# Patient Record
Sex: Male | Born: 1935 | Race: White | Hispanic: No | Marital: Married | Smoking: Never smoker
Health system: Southern US, Community
[De-identification: ages and names within clinical notes are randomized; demographics above are authoritative.]

---

## 1998-03-07 ENCOUNTER — Ambulatory Visit (HOSPITAL_COMMUNITY): Admission: RE | Admit: 1998-03-07 | Discharge: 1998-03-07 | Payer: Self-pay

## 1998-06-05 ENCOUNTER — Other Ambulatory Visit: Admission: RE | Admit: 1998-06-05 | Discharge: 1998-06-05 | Payer: Self-pay | Admitting: Gastroenterology

## 2000-01-26 ENCOUNTER — Ambulatory Visit (HOSPITAL_COMMUNITY): Admission: RE | Admit: 2000-01-26 | Discharge: 2000-01-26 | Payer: Self-pay | Admitting: Orthopedic Surgery

## 2002-11-09 ENCOUNTER — Encounter: Payer: Self-pay | Admitting: Orthopedic Surgery

## 2002-11-09 ENCOUNTER — Encounter: Admission: RE | Admit: 2002-11-09 | Discharge: 2002-11-09 | Payer: Self-pay | Admitting: Orthopedic Surgery

## 2005-09-15 ENCOUNTER — Ambulatory Visit: Payer: Self-pay | Admitting: Gastroenterology

## 2005-10-08 ENCOUNTER — Ambulatory Visit: Payer: Self-pay | Admitting: Cardiology

## 2005-10-14 ENCOUNTER — Ambulatory Visit: Payer: Self-pay | Admitting: Internal Medicine

## 2005-10-28 ENCOUNTER — Ambulatory Visit: Payer: Self-pay | Admitting: Cardiology

## 2005-10-29 ENCOUNTER — Ambulatory Visit: Payer: Self-pay | Admitting: Cardiology

## 2005-11-03 ENCOUNTER — Ambulatory Visit: Payer: Self-pay | Admitting: Internal Medicine

## 2005-11-05 ENCOUNTER — Ambulatory Visit: Payer: Self-pay | Admitting: Cardiology

## 2005-11-17 ENCOUNTER — Ambulatory Visit: Payer: Self-pay | Admitting: Internal Medicine

## 2005-12-28 ENCOUNTER — Ambulatory Visit: Payer: Self-pay | Admitting: Internal Medicine

## 2006-03-07 ENCOUNTER — Ambulatory Visit: Payer: Self-pay | Admitting: Internal Medicine

## 2006-12-06 ENCOUNTER — Ambulatory Visit: Payer: Self-pay | Admitting: Internal Medicine

## 2006-12-06 LAB — CONVERTED CEMR LAB
ALT: 17 units/L (ref 0–40)
AST: 20 units/L (ref 0–37)
Alkaline Phosphatase: 54 units/L (ref 39–117)
BUN: 31 mg/dL — ABNORMAL HIGH (ref 6–23)
Basophils Relative: 0.2 % (ref 0.0–1.0)
Bilirubin, Direct: 0.2 mg/dL (ref 0.0–0.3)
CO2: 27 meq/L (ref 19–32)
Calcium: 9.1 mg/dL (ref 8.4–10.5)
Chloride: 111 meq/L (ref 96–112)
Creatinine, Ser: 1.1 mg/dL (ref 0.4–1.5)
Eosinophils Relative: 2.2 % (ref 0.0–5.0)
GFR calc Af Amer: 84 mL/min
Glucose, Bld: 105 mg/dL — ABNORMAL HIGH (ref 70–99)
LDL Cholesterol: 100 mg/dL — ABNORMAL HIGH (ref 0–99)
Lymphocytes Relative: 27.3 % (ref 12.0–46.0)
Monocytes Relative: 9.3 % (ref 3.0–11.0)
Neutro Abs: 3 10*3/uL (ref 1.4–7.7)
Platelets: 266 10*3/uL (ref 150–400)
RDW: 12.9 % (ref 11.5–14.6)
Total Bilirubin: 0.9 mg/dL (ref 0.3–1.2)
Total Protein: 6.2 g/dL (ref 6.0–8.3)
Triglycerides: 45 mg/dL (ref 0–149)
VLDL: 9 mg/dL (ref 0–40)
WBC: 5 10*3/uL (ref 4.5–10.5)

## 2007-05-03 ENCOUNTER — Ambulatory Visit: Payer: Self-pay | Admitting: Pulmonary Disease

## 2007-05-03 ENCOUNTER — Ambulatory Visit: Payer: Self-pay | Admitting: Internal Medicine

## 2007-07-25 ENCOUNTER — Telehealth: Payer: Self-pay | Admitting: Internal Medicine

## 2007-08-07 IMAGING — CT CT PARANASAL SINUSES LIMITED
1 of 2 series · 16 of 30 positions shown, 20 images · IV contrast (agent unspecified)
Comparison: None.

CLINICAL DATA: Cough and sinus drainage for 2 months.  No previous sinus surgery.
LIMITED CT OF PARANASAL SINUSES WITHOUT CONTRAST:
TECHNIQUE: Limited coronal CT images were obtained through the paranasal sinuses without intravenous contrast.

[Series 4: ltd sinus 3.0 h30s · axial · 0.29mm/px · z∈[-106,+17]mm · 16 of 30 slices shown, 20 images]
[im 2/30  brain]
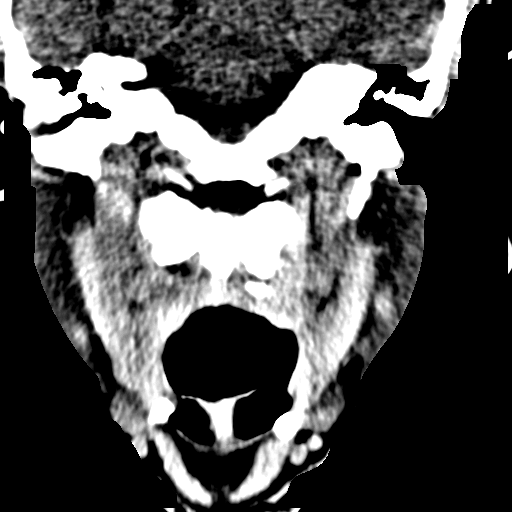
[im 2/30  bone]
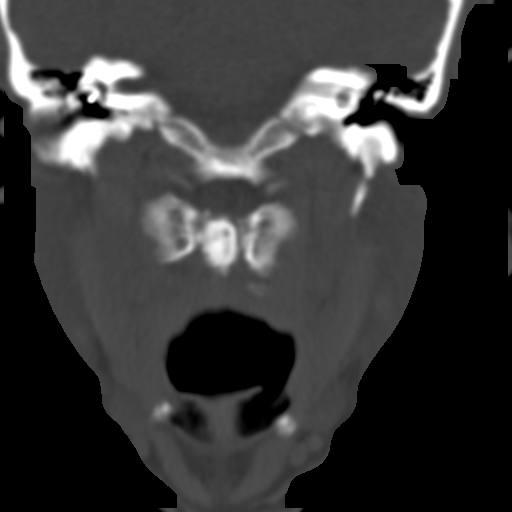
[im 4/30  bone]
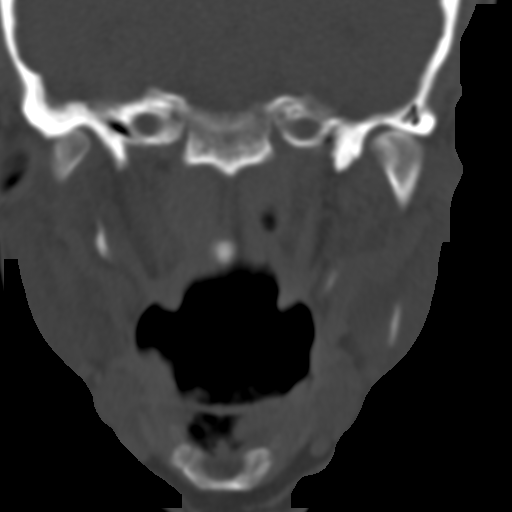
[im 6/30  bone]
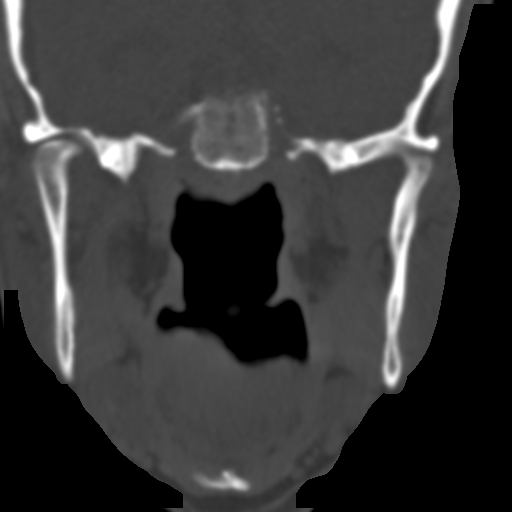
[im 7/30  bone]
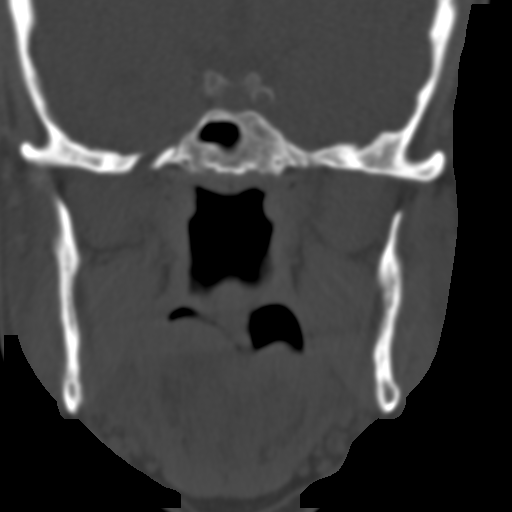
[im 9/30  brain]
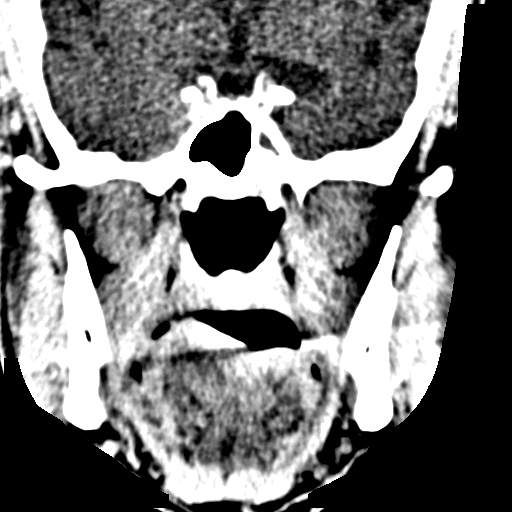
[im 9/30  bone]
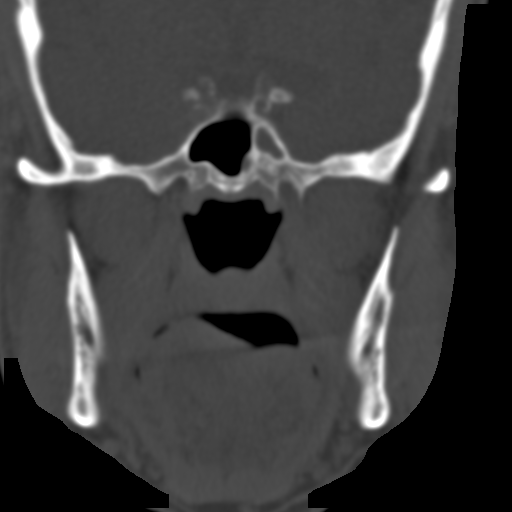
[im 11/30  bone]
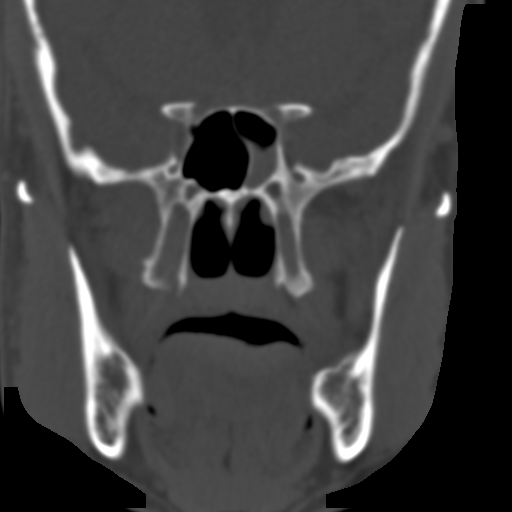
[im 12/30  bone]
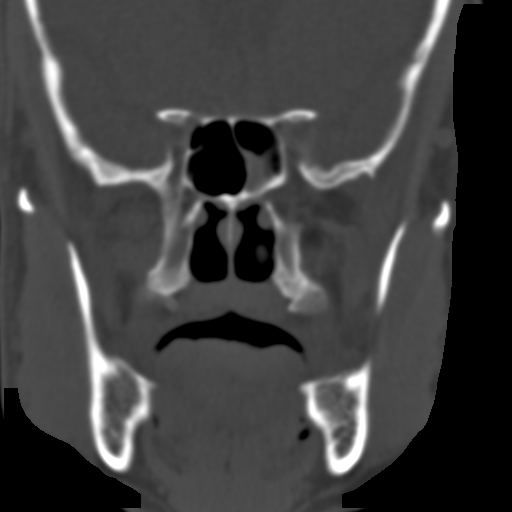
[im 14/30  bone]
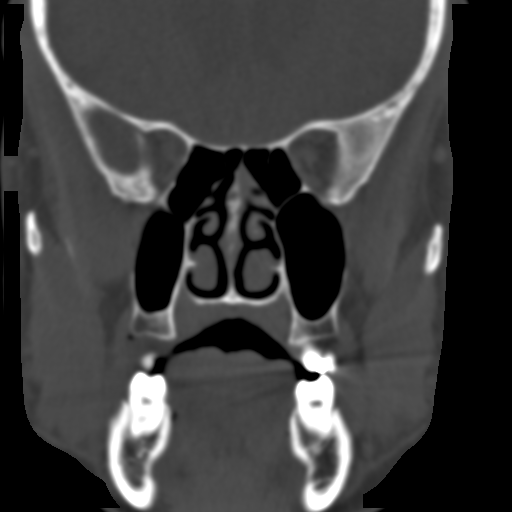
[im 16/30  brain]
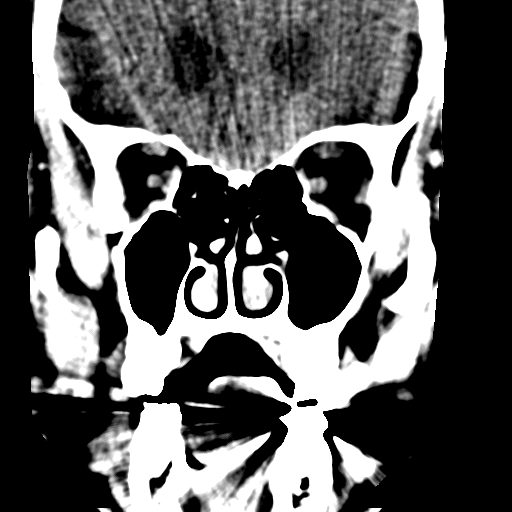
[im 16/30  bone]
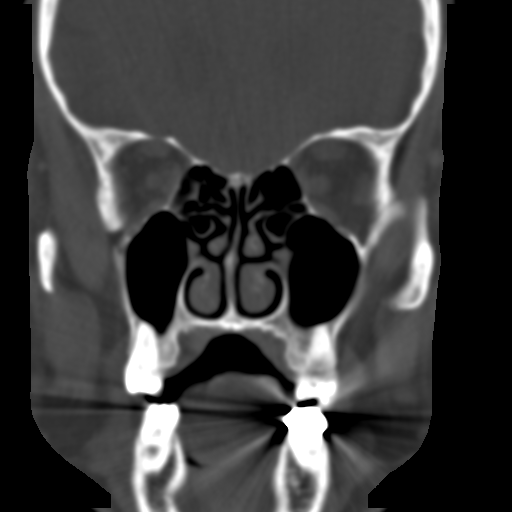
[im 18/30  bone]
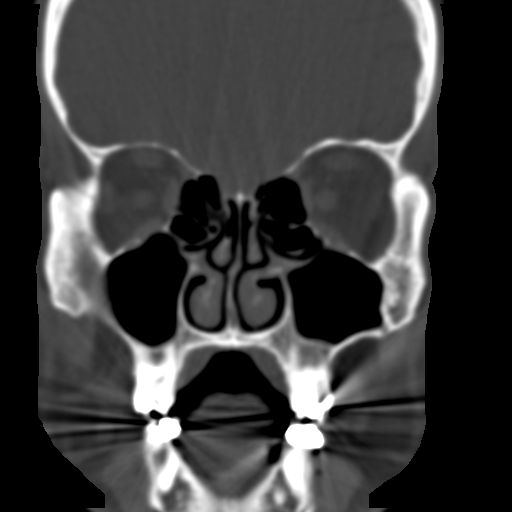
[im 19/30  bone]
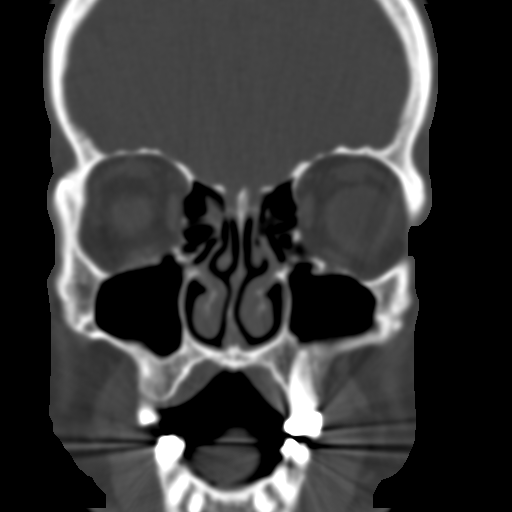
[im 21/30  bone]
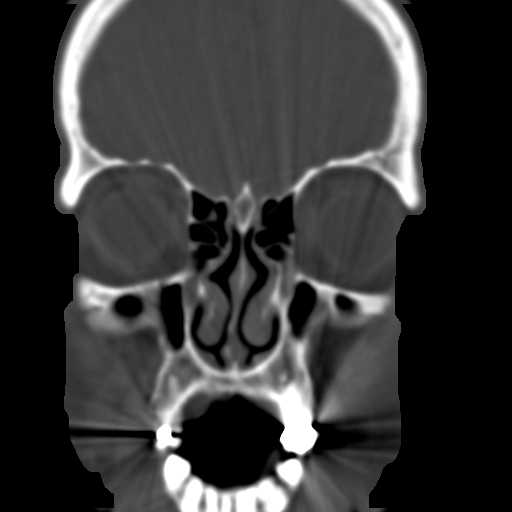
[im 23/30  brain]
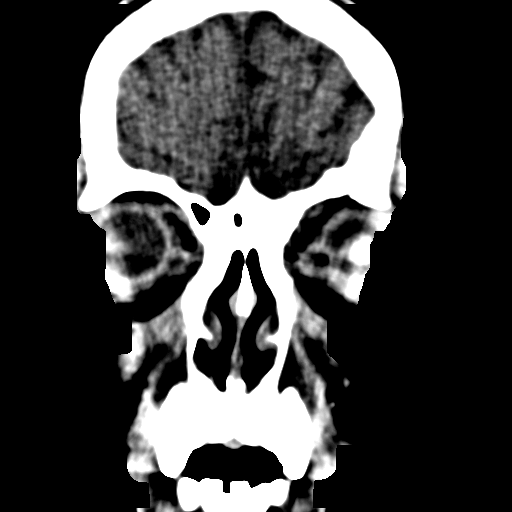
[im 23/30  bone]
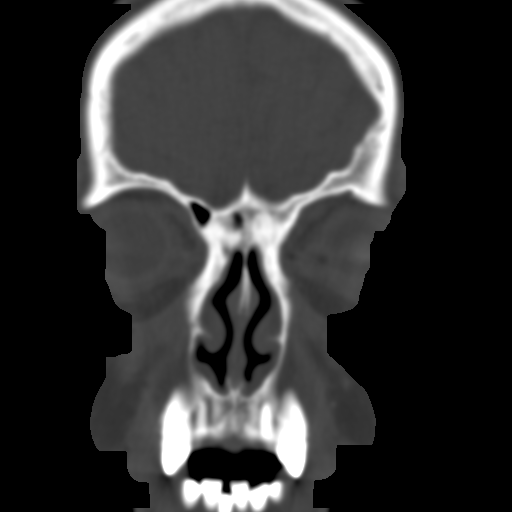
[im 24/30  bone]
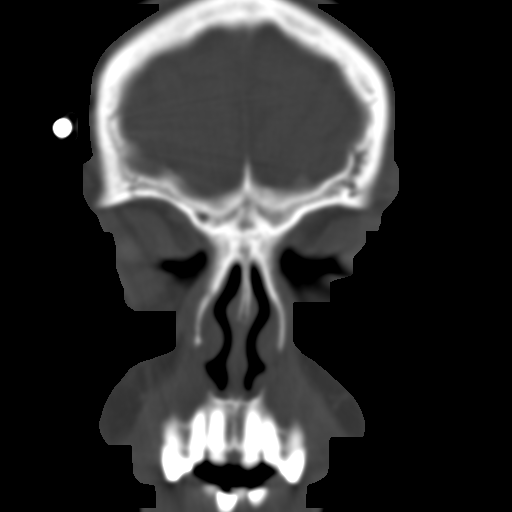
[im 26/30  bone]
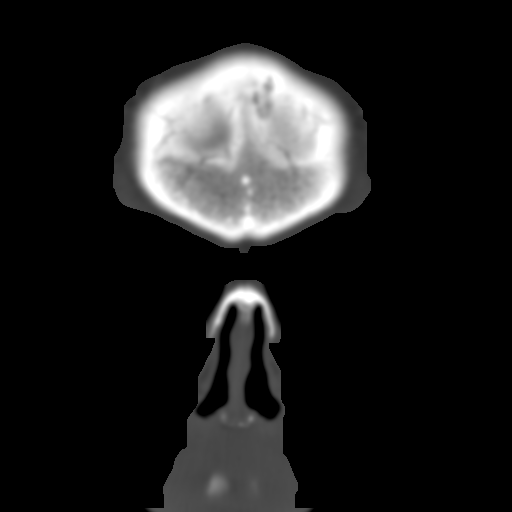
[im 28/30  bone]
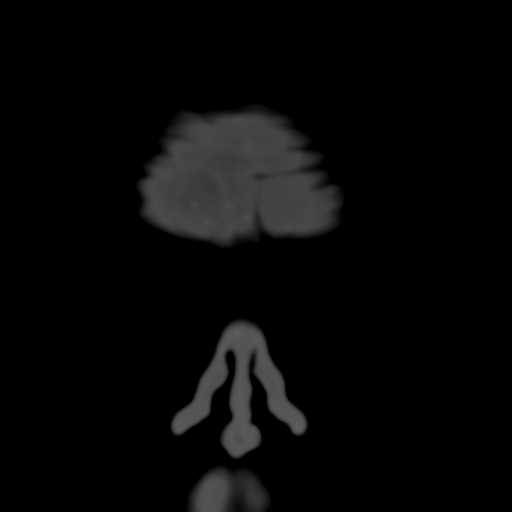

[16 of 30 positions shown; findings below may reference images not displayed]

FINDINGS: There is some mucosal thickening in the left division of the sphenoid sinus.  The paranasal sinuses are otherwise well aerated without mucosal thickening.  The frontal sinuses appear incompletely pneumatized.  No bone destruction or obstructive lesion is demonstrated by this limited screening exam.  The infundibula do appear somewhat narrowed bilaterally.  No orbital abnormalities are seen.  There is an apparent metallic stapes implant on the right.
IMPRESSION: Mild mucosal thickening in the left division of the sphenoid sinus.  No other active sinus disease demonstrated.

## 2007-11-09 ENCOUNTER — Encounter: Payer: Self-pay | Admitting: Internal Medicine

## 2008-05-08 ENCOUNTER — Telehealth (INDEPENDENT_AMBULATORY_CARE_PROVIDER_SITE_OTHER): Payer: Self-pay | Admitting: *Deleted

## 2008-05-08 ENCOUNTER — Ambulatory Visit: Payer: Self-pay | Admitting: Internal Medicine

## 2008-05-08 DIAGNOSIS — R5381 Other malaise: Secondary | ICD-10-CM | POA: Insufficient documentation

## 2008-05-08 DIAGNOSIS — J309 Allergic rhinitis, unspecified: Secondary | ICD-10-CM | POA: Insufficient documentation

## 2008-05-08 DIAGNOSIS — R350 Frequency of micturition: Secondary | ICD-10-CM

## 2008-05-08 DIAGNOSIS — R5383 Other fatigue: Secondary | ICD-10-CM

## 2008-05-08 DIAGNOSIS — E785 Hyperlipidemia, unspecified: Secondary | ICD-10-CM

## 2008-05-08 DIAGNOSIS — N318 Other neuromuscular dysfunction of bladder: Secondary | ICD-10-CM

## 2008-05-08 DIAGNOSIS — N400 Enlarged prostate without lower urinary tract symptoms: Secondary | ICD-10-CM

## 2008-05-08 DIAGNOSIS — Z860100 Personal history of colon polyps, unspecified: Secondary | ICD-10-CM | POA: Insufficient documentation

## 2008-05-08 DIAGNOSIS — Z8719 Personal history of other diseases of the digestive system: Secondary | ICD-10-CM

## 2008-05-08 DIAGNOSIS — I10 Essential (primary) hypertension: Secondary | ICD-10-CM | POA: Insufficient documentation

## 2008-05-08 DIAGNOSIS — K219 Gastro-esophageal reflux disease without esophagitis: Secondary | ICD-10-CM | POA: Insufficient documentation

## 2008-05-08 DIAGNOSIS — N453 Epididymo-orchitis: Secondary | ICD-10-CM | POA: Insufficient documentation

## 2008-05-08 LAB — CONVERTED CEMR LAB
ALT: 16 units/L (ref 0–53)
AST: 18 units/L (ref 0–37)
Albumin: 3.8 g/dL (ref 3.5–5.2)
BUN: 37 mg/dL — ABNORMAL HIGH (ref 6–23)
Basophils Absolute: 0 10*3/uL (ref 0.0–0.1)
Basophils Relative: 0.5 % (ref 0.0–3.0)
CO2: 28 meq/L (ref 19–32)
Chloride: 107 meq/L (ref 96–112)
Cholesterol: 177 mg/dL (ref 0–200)
Creatinine, Ser: 1.5 mg/dL (ref 0.4–1.5)
Crystals: NEGATIVE
Glucose, Bld: 93 mg/dL (ref 70–99)
Hemoglobin: 12.2 g/dL — ABNORMAL LOW (ref 13.0–17.0)
Ketones, ur: NEGATIVE mg/dL
LDL Cholesterol: 107 mg/dL — ABNORMAL HIGH (ref 0–99)
Leukocytes, UA: NEGATIVE
Lymphocytes Relative: 25.8 % (ref 12.0–46.0)
MCHC: 35.4 g/dL (ref 30.0–36.0)
Monocytes Relative: 10.5 % (ref 3.0–12.0)
Neutro Abs: 3.5 10*3/uL (ref 1.4–7.7)
Neutrophils Relative %: 61.5 % (ref 43.0–77.0)
Nitrite: NEGATIVE
PSA: 0.4 ng/mL (ref 0.10–4.00)
RBC / HPF: NONE SEEN
RBC: 3.58 M/uL — ABNORMAL LOW (ref 4.22–5.81)
Specific Gravity, Urine: 1.015 (ref 1.000–1.03)
Total CHOL/HDL Ratio: 3.5
Total Protein, Urine: NEGATIVE mg/dL
Total Protein: 6.9 g/dL (ref 6.0–8.3)
VLDL: 19 mg/dL (ref 0–40)
Vitamin B-12: 733 pg/mL (ref 211–911)
WBC: 5.7 10*3/uL (ref 4.5–10.5)
pH: 5 (ref 5.0–8.0)

## 2008-05-09 ENCOUNTER — Telehealth: Payer: Self-pay | Admitting: Internal Medicine

## 2008-05-16 ENCOUNTER — Encounter: Admission: RE | Admit: 2008-05-16 | Discharge: 2008-05-16 | Payer: Self-pay | Admitting: Internal Medicine

## 2008-05-17 ENCOUNTER — Encounter: Payer: Self-pay | Admitting: Internal Medicine

## 2008-12-13 ENCOUNTER — Encounter (INDEPENDENT_AMBULATORY_CARE_PROVIDER_SITE_OTHER): Payer: Self-pay | Admitting: *Deleted

## 2009-09-05 ENCOUNTER — Telehealth: Payer: Self-pay | Admitting: Internal Medicine

## 2010-08-18 NOTE — Progress Notes (Signed)
Summary: Changed Practices  Phone Note Outgoing Call Call back at Home Phone [phone]   Call placed by: Harlow Mares CMA Duncan Dull),  September 05, 2009 11:45 AM Call placed to: Patient Summary of Call: patient states that he had a colonoscopy 3 weeks ago closer to home, I will have Lady Gary put a note in the system that the patient has changed practices.  Initial call taken by: Harlow Mares CMA (AAMA),  September 05, 2009 11:46 AM

## 2010-12-04 NOTE — Assessment & Plan Note (Signed)
[name]                               PULMONARY OFFICE NOTE   NAME:POEAdison, Ian                        MRN:          [id number]  DATE:03/07/2006                            DOB:          Nov 01, 1929    HISTORY:  A 77 year old white male with a cough dating back to February 2007  with marked improvement on PPI plus Reglan 10 mg one-half q.i.d.  It is not  to me that he is actually still taking the medicine, but overall feels much  better.  He has occasional cough in the morning but minimal mucous  production with no chest pain, fevers, chills, sweats or dyspnea.   Again, he does not know what medicines he is taking.   PHYSICAL EXAMINATION:  He is a pleasant, ambulatory white man in no acute  distress.  Stable vital signs.  HEENT:  Unremarkable.  Pharynx clear.  LUNGS:  Fields are completely clear bilaterally to auscultation and  percussion.  HEART:  Regular rate and rhythm without murmur, gallop or rub.  ABDOMEN:  Soft, benign.  EXTREMITIES:  Warm without calf tenderness, cyanosis, clubbing or edema.   IMPRESSION:  This patient appears to have clear-cut evidence of  gastroesophageal reflux disease with previous response to proton pump  inhibitor and then further improvement on promotility agents.  The issues is  whether or not he needs to continue aggressive treatment directed at  gastroesophageal reflux disease long-term.  Since he did not have the  chronic cough previously, I am going to ask him to just completely stop all  the medicines, the reverse of a therapeutic trial.  If the cough flares, I  am going to ask him to restart all the medicines that previously controlled  the cough but then present here with all of his medicines in hand so that we  can actually do full medication reconciliation and consider whether or not  gastrointestinal evaluation is necessary (if he requires aggressive  treatment directed at gastroesophageal reflux  disease chronically, I would  strongly recommend at least one endoscopy to rule out other complications of  chronic gastroesophageal reflux disease such as stricture or Barrett's,  noting that Dr. Victorino Dike is his gastrointestinal physician of record.)  Pulmonary followup, however, can be p.r.n.                                   [name]. [name], MD, [facility]   MBW/MedQ  DD:  03/07/2006  DT:  03/07/2006  Job #:  [id number]

## 2010-12-04 NOTE — Op Note (Signed)
[place]  Patient:    MACIO, KISSOON                        MRN: [id number] Proc. Date: 01/26/00 Adm. Date:  [id number] Attending:  Marlowe Kays Page                           Operative Report  PREOPERATIVE DIAGNOSIS:  Left carpal tunnel syndrome, status post right carpal tunnel release.  POSTOPERATIVE DIAGNOSIS:  Left carpal tunnel syndrome, status post right carpal tunnel release.  OPERATION:  Decompression of median nerve, left wrist and hand.  SURGEON:  Illene Labrador. [place], M.D.  ASSISTANT:  Nurse.  ANESTHESIA:  IV regional.  PATHOLOGY AND JUSTIFICATION FOR PROCEDURE:  I performed carpal tunnel surgery on his right hand in 1997.  He has had a nice result from it.  At that time, conduction studies demonstrated delay in the left hand, but until recently, he has not had nothing in the way of symptoms to warrant treatment ______ of the hand.  He has significant numbness in the fingers.  At surgery, he had significant compression of the median nerve in the carpal canal, and much of this compression was due to thenar muscles lying on top of the nerve and which gave better explanation for the type of symptoms he was having.  When I ______, he also had a splinter remnant in the tip of his index finger, but he had taken care of that prior to surgery on his own at home, so that no surgery here was indicated.  DESCRIPTION OF PROCEDURE:  Satisfactory IV regional anesthesia.  DuraPrep from the forearm to fingertips was draped in the sterile field.  I marked out a curved incision along the base of the thenar eminence, crossing obliquely at the flexor crease, the wrist, and the distal forearm.  The palmaris longus tendon beneath the median nerve were identified at the wrist.  Using bipolar cautery, potential bleeders were coagulated.  Decompression of the nerve was made into the distal palm, releasing skin, subcutaneous tissue, thick fascia, and  actually the tendon of it in the carpal canal.  When the decompression had been completed.  The neurovascular bundle dissected out with a small hemostat and the wound was irrigated with sterile saline, and the wound closed.  The skin and subcutaneous tissue only with interrupted 4-0 nylon mattress sutures.  The wound edges were infiltrated with 0.5% plain Marcaine.  Betadine Adaptic dry sterile dressing and a volar plaster splint were applied.  The tourniquet was released and he tolerated the procedure well and was taken to the recovery room in satisfactory condition with no known complications. DD:  01/26/00 TD:  01/26/00 Job: [id number]

## 2014-05-25 ENCOUNTER — Encounter: Payer: Self-pay | Admitting: Internal Medicine

## 2014-05-25 ENCOUNTER — Inpatient Hospital Stay (HOSPITAL_COMMUNITY)
Admission: AD | Admit: 2014-05-25 | Discharge: 2014-05-30 | DRG: 193 | Disposition: A | Discharge status: Skilled Nursing Facility | Payer: Medicare Other | Source: Other Acute Inpatient Hospital | Attending: Family Medicine | Admitting: Family Medicine

## 2014-05-25 ENCOUNTER — Inpatient Hospital Stay (HOSPITAL_COMMUNITY): Payer: Medicare Other

## 2014-05-25 DIAGNOSIS — I701 Atherosclerosis of renal artery: Secondary | ICD-10-CM | POA: Diagnosis present

## 2014-05-25 DIAGNOSIS — Z95828 Presence of other vascular implants and grafts: Secondary | ICD-10-CM | POA: Diagnosis not present

## 2014-05-25 DIAGNOSIS — J189 Pneumonia, unspecified organism: Secondary | ICD-10-CM | POA: Diagnosis present

## 2014-05-25 DIAGNOSIS — I10 Essential (primary) hypertension: Secondary | ICD-10-CM

## 2014-05-25 DIAGNOSIS — N179 Acute kidney failure, unspecified: Secondary | ICD-10-CM | POA: Diagnosis not present

## 2014-05-25 DIAGNOSIS — Z66 Do not resuscitate: Secondary | ICD-10-CM | POA: Diagnosis present

## 2014-05-25 DIAGNOSIS — R739 Hyperglycemia, unspecified: Secondary | ICD-10-CM | POA: Diagnosis present

## 2014-05-25 DIAGNOSIS — N400 Enlarged prostate without lower urinary tract symptoms: Secondary | ICD-10-CM | POA: Diagnosis present

## 2014-05-25 DIAGNOSIS — Y95 Nosocomial condition: Secondary | ICD-10-CM | POA: Diagnosis present

## 2014-05-25 DIAGNOSIS — R0602 Shortness of breath: Secondary | ICD-10-CM | POA: Insufficient documentation

## 2014-05-25 DIAGNOSIS — Z79899 Other long term (current) drug therapy: Secondary | ICD-10-CM | POA: Diagnosis not present

## 2014-05-25 DIAGNOSIS — D649 Anemia, unspecified: Secondary | ICD-10-CM | POA: Diagnosis present

## 2014-05-25 DIAGNOSIS — I482 Chronic atrial fibrillation: Secondary | ICD-10-CM

## 2014-05-25 DIAGNOSIS — J9601 Acute respiratory failure with hypoxia: Secondary | ICD-10-CM | POA: Diagnosis present

## 2014-05-25 DIAGNOSIS — E876 Hypokalemia: Secondary | ICD-10-CM | POA: Diagnosis not present

## 2014-05-25 DIAGNOSIS — I129 Hypertensive chronic kidney disease with stage 1 through stage 4 chronic kidney disease, or unspecified chronic kidney disease: Secondary | ICD-10-CM | POA: Diagnosis present

## 2014-05-25 DIAGNOSIS — N183 Chronic kidney disease, stage 3 unspecified: Secondary | ICD-10-CM | POA: Diagnosis present

## 2014-05-25 DIAGNOSIS — R7989 Other specified abnormal findings of blood chemistry: Secondary | ICD-10-CM | POA: Diagnosis present

## 2014-05-25 DIAGNOSIS — T501X5A Adverse effect of loop [high-ceiling] diuretics, initial encounter: Secondary | ICD-10-CM | POA: Diagnosis present

## 2014-05-25 DIAGNOSIS — I4891 Unspecified atrial fibrillation: Secondary | ICD-10-CM | POA: Diagnosis present

## 2014-05-25 DIAGNOSIS — F0390 Unspecified dementia without behavioral disturbance: Secondary | ICD-10-CM

## 2014-05-25 DIAGNOSIS — Z885 Allergy status to narcotic agent status: Secondary | ICD-10-CM

## 2014-05-25 DIAGNOSIS — E785 Hyperlipidemia, unspecified: Secondary | ICD-10-CM | POA: Diagnosis present

## 2014-05-25 DIAGNOSIS — J9600 Acute respiratory failure, unspecified whether with hypoxia or hypercapnia: Secondary | ICD-10-CM

## 2014-05-25 LAB — BLOOD GAS, ARTERIAL
Acid-base deficit: 3.3 mmol/L — ABNORMAL HIGH (ref 0.0–2.0)
Bicarbonate: 19.7 mEq/L — ABNORMAL LOW (ref 20.0–24.0)
Drawn by: 10552
O2 Content: 4 L/min
O2 Saturation: 94.9 %
PCO2 ART: 26.4 mmHg — AB (ref 35.0–45.0)
PO2 ART: 72.4 mmHg — AB (ref 80.0–100.0)
Patient temperature: 98.6
TCO2: 20.5 mmol/L (ref 0–100)
pH, Arterial: 7.484 — ABNORMAL HIGH (ref 7.350–7.450)

## 2014-05-25 LAB — LIPID PANEL
CHOL/HDL RATIO: 3.7 ratio
Cholesterol: 147 mg/dL (ref 0–200)
HDL: 40 mg/dL (ref >39)
LDL CALC: 92 mg/dL (ref 0–99)
Triglycerides: 74 mg/dL (ref <150)
VLDL: 15 mg/dL (ref 0–40)

## 2014-05-25 LAB — CBC WITH DIFFERENTIAL/PLATELET
Basophils Absolute: 0 10*3/uL (ref 0.0–0.1)
Basophils Relative: 0 % (ref 0–1)
EOS ABS: 0 10*3/uL (ref 0.0–0.7)
EOS PCT: 0 % (ref 0–5)
HEMATOCRIT: 32.2 % — AB (ref 39.0–52.0)
Hemoglobin: 10.8 g/dL — ABNORMAL LOW (ref 13.0–17.0)
LYMPHS ABS: 0.9 10*3/uL (ref 0.7–4.0)
LYMPHS PCT: 6 % — AB (ref 12–46)
MCH: 30.1 pg (ref 26.0–34.0)
MCHC: 33.5 g/dL (ref 30.0–36.0)
MCV: 89.7 fL (ref 78.0–100.0)
MONO ABS: 0.9 10*3/uL (ref 0.1–1.0)
Monocytes Relative: 6 % (ref 3–12)
Neutro Abs: 12.3 10*3/uL — ABNORMAL HIGH (ref 1.7–7.7)
Neutrophils Relative %: 87 % — ABNORMAL HIGH (ref 43–77)
Platelets: 534 10*3/uL — ABNORMAL HIGH (ref 150–400)
RBC: 3.59 MIL/uL — AB (ref 4.22–5.81)
RDW: 13.3 % (ref 11.5–15.5)
WBC: 14.1 10*3/uL — ABNORMAL HIGH (ref 4.0–10.5)

## 2014-05-25 LAB — COMPREHENSIVE METABOLIC PANEL
ALBUMIN: 2.6 g/dL — AB (ref 3.5–5.2)
ALK PHOS: 58 U/L (ref 39–117)
ALT: 73 U/L — AB (ref 0–53)
AST: 66 U/L — ABNORMAL HIGH (ref 0–37)
Anion gap: 18 — ABNORMAL HIGH (ref 5–15)
BUN: 23 mg/dL (ref 6–23)
CHLORIDE: 97 meq/L (ref 96–112)
CO2: 20 mEq/L (ref 19–32)
Calcium: 9.3 mg/dL (ref 8.4–10.5)
Creatinine, Ser: 1.7 mg/dL — ABNORMAL HIGH (ref 0.50–1.35)
GFR calc Af Amer: 41 mL/min — ABNORMAL LOW (ref >90)
GFR calc non Af Amer: 36 mL/min — ABNORMAL LOW (ref >90)
Glucose, Bld: 158 mg/dL — ABNORMAL HIGH (ref 70–99)
POTASSIUM: 4.5 meq/L (ref 3.7–5.3)
Sodium: 135 mEq/L — ABNORMAL LOW (ref 137–147)
Total Bilirubin: 0.5 mg/dL (ref 0.3–1.2)
Total Protein: 6.8 g/dL (ref 6.0–8.3)

## 2014-05-25 LAB — MRSA PCR SCREENING: MRSA by PCR: NEGATIVE

## 2014-05-25 LAB — TSH: TSH: 0.81 u[IU]/mL (ref 0.350–4.500)

## 2014-05-25 LAB — MAGNESIUM: Magnesium: 1.9 mg/dL (ref 1.5–2.5)

## 2014-05-25 LAB — PHOSPHORUS: Phosphorus: 4.9 mg/dL — ABNORMAL HIGH (ref 2.3–4.6)

## 2014-05-25 MED ORDER — ACETAMINOPHEN 650 MG RE SUPP: 650.0000 mg | Freq: Four times a day (QID) | RECTAL | Status: DC | PRN

## 2014-05-25 MED ORDER — AMLODIPINE BESYLATE 10 MG PO TABS
10.0000 mg | ORAL_TABLET | Freq: Every day | ORAL | Status: DC
  Administered 2014-05-25 – 2014-05-30 (×6): 10 mg via ORAL
  Filled 2014-05-25 (×6): qty 1

## 2014-05-25 MED ORDER — VANCOMYCIN HCL IN DEXTROSE 1-5 GM/200ML-% IV SOLN
1000.0000 mg | INTRAVENOUS | Status: DC
  Administered 2014-05-25 – 2014-05-28 (×4): 1000 mg via INTRAVENOUS
  Filled 2014-05-25 (×6): qty 200

## 2014-05-25 MED ORDER — TAMSULOSIN HCL 0.4 MG PO CAPS
0.4000 mg | ORAL_CAPSULE | Freq: Every day | ORAL | Status: DC
  Administered 2014-05-25 – 2014-05-29 (×5): 0.4 mg via ORAL
  Filled 2014-05-25 (×6): qty 1

## 2014-05-25 MED ORDER — ENOXAPARIN SODIUM 40 MG/0.4ML ~~LOC~~ SOLN
40.0000 mg | SUBCUTANEOUS | Status: DC
  Administered 2014-05-25 – 2014-05-26 (×2): 40 mg via SUBCUTANEOUS
  Filled 2014-05-25 (×3): qty 0.4

## 2014-05-25 MED ORDER — SODIUM CHLORIDE 0.9 % IJ SOLN
3.0000 mL | Freq: Two times a day (BID) | INTRAMUSCULAR | Status: DC
  Administered 2014-05-25 – 2014-05-28 (×5): 3 mL via INTRAVENOUS

## 2014-05-25 MED ORDER — PIPERACILLIN-TAZOBACTAM 3.375 G IVPB
3.3750 g | Freq: Three times a day (TID) | INTRAVENOUS | Status: DC
  Administered 2014-05-25 – 2014-05-29 (×12): 3.375 g via INTRAVENOUS
  Filled 2014-05-25 (×15): qty 50

## 2014-05-25 MED ORDER — MEMANTINE HCL 10 MG PO TABS
10.0000 mg | ORAL_TABLET | Freq: Two times a day (BID) | ORAL | Status: DC
  Administered 2014-05-25 – 2014-05-30 (×10): 10 mg via ORAL
  Filled 2014-05-25 (×11): qty 1

## 2014-05-25 MED ORDER — SODIUM CHLORIDE 0.9 % IV SOLN: 250.0000 mL | INTRAVENOUS | Status: DC | PRN

## 2014-05-25 MED ORDER — FUROSEMIDE 10 MG/ML IJ SOLN
60.0000 mg | Freq: Two times a day (BID) | INTRAMUSCULAR | Status: DC
  Administered 2014-05-25 – 2014-05-30 (×9): 60 mg via INTRAVENOUS
  Filled 2014-05-25 (×12): qty 6

## 2014-05-25 MED ORDER — ACETAMINOPHEN 325 MG PO TABS: 650.0000 mg | ORAL_TABLET | Freq: Four times a day (QID) | ORAL | Status: DC | PRN

## 2014-05-25 MED ORDER — IPRATROPIUM-ALBUTEROL 0.5-2.5 (3) MG/3ML IN SOLN
3.0000 mL | RESPIRATORY_TRACT | Status: DC
  Administered 2014-05-25 – 2014-05-27 (×10): 3 mL via RESPIRATORY_TRACT
  Filled 2014-05-25 (×10): qty 3

## 2014-05-25 MED ORDER — DM-GUAIFENESIN ER 30-600 MG PO TB12
1.0000 | ORAL_TABLET | Freq: Two times a day (BID) | ORAL | Status: DC
  Administered 2014-05-25 – 2014-05-30 (×10): 1 via ORAL
  Filled 2014-05-25 (×11): qty 1

## 2014-05-25 MED ORDER — PANTOPRAZOLE SODIUM 40 MG PO TBEC
40.0000 mg | DELAYED_RELEASE_TABLET | Freq: Every day | ORAL | Status: DC
Start: 2014-05-25 — End: 2014-05-30
  Administered 2014-05-26 – 2014-05-30 (×5): 40 mg via ORAL
  Filled 2014-05-25 (×5): qty 1

## 2014-05-25 MED ORDER — SODIUM CHLORIDE 0.9 % IJ SOLN: 3.0000 mL | INTRAMUSCULAR | Status: DC | PRN

## 2014-05-25 NOTE — H&P (Signed)
[facility] History and Physical  Ian LameClifton F Dewoody ZOX:[id number] DOB: 09-Jul-1930 DOA: 05/25/2014  Referring physician:  PCP: [name], MD  Specialists:   Chief Complaint: Shortness of breath  HPI: [name] is a 80 y.o. male  With a history of atrial fibrillation, hypertension, dementia on no anticoagulation, recent hospitalization and transfer [facility] to [facility] for failure to improve. Patient presented to [facility] for suspected urinary tract infection. Patient was placed on Levaquin and ceftriaxone. He then proceeded to develop pneumonia and was placed on Levaquin and Zosyn. Patient required BiPAP as well as 4 L of nasal cannula. Patient does not wear or use oxygen at home. Per family request, patient was transferred to [facility]. It appears that patient had been given plenty of fluids during his hospitalization for sepsis at [facility]. Echocardiogram was not obtained however patient did have elevated BNP. At the present admission, patient has no complaints. Per family patient has had shortness of breath, cough, some abdominal distention and pain. He did have a bowel movement earlier this morning.  Review of Systems:  Unobtainable from patient due to history of dementia.   Per family: patient complained of SOB, abdominal pain.   No past medical history on file. No past surgical history on file. Social History:  has no tobacco, alcohol, and drug history on file.   Allergies  Allergen Reactions  . Codeine     Reviewed with family.   Mother- stroke  Prior to Admission medications   Not on File   Physical Exam: Filed Vitals:   05/25/14 1620  BP: 149/72  Pulse: 105  Temp: 98.1 F (36.7 C)  Resp: 30     General: Well developed, well nourished, NAD, appears stated age  HEENT: NCAT, PERRLA, EOMI, Anicteic Sclera, mucous membranes moist.   Neck: Supple, +JVD, no masses  Cardiovascular: S1 S2 auscultated, 2/6 SEM,  irregular  Respiratory: diminished breath sounds however are essentially clear, no wheezes or rhonchi is noted  Abdomen: Soft, nontender, mildly distended, + bowel sounds  Extremities: warm dry without cyanosis clubbing or edema in LE.  +edema in LUE  Neuro: AAOx2, cranial nerves grossly intact. Strength equal and bilateral in upper/lower ext  Skin: Without rashes exudates or nodules  Psych: Appropriate mood and affect  Labs on Admission:  Basic Metabolic Panel: No results for input(s): NA, K, CL, CO2, GLUCOSE, BUN, CREATININE, CALCIUM, MG, PHOS in the last 168 hours. Liver Function Tests: No results for input(s): AST, ALT, ALKPHOS, BILITOT, PROT, ALBUMIN in the last 168 hours. No results for input(s): LIPASE, AMYLASE in the last 168 hours. No results for input(s): AMMONIA in the last 168 hours. CBC: No results for input(s): WBC, NEUTROABS, HGB, HCT, MCV, PLT in the last 168 hours. Cardiac Enzymes: No results for input(s): CKTOTAL, CKMB, CKMBINDEX, TROPONINI in the last 168 hours.  BNP (last 3 results) No results for input(s): PROBNP in the last 8760 hours. CBG: No results for input(s): GLUCAP in the last 168 hours.  Radiological Exams on Admission: No results found.  EKG: none Cardiac monitor shows atrial fibrillation, rate 96  Assessment/Plan Acute respiratory failure -Patient was transferred from [facility] to [facility] -Patient was initially admitted to [facility] with pneumonia however failed to improve -possibly complicated by non-resolving pneumonia versus new diagnosis of CHF and atrial fibrillation -Currently requiring 4 L oxygen via nasal cannula as well as BiPAP at night -Patient's family or questions pulmonology consult -Will obtain chest x-ray, CBC  with differential, CMP, echocardiogram -will place patient on empiric antibiotics with vancomycin and Zosyn, DuoNeb treatments, guaifenesin  Elevated BNP -Patient does not have a formal diagnosis of heart  failure however BNP noted I [facility] was over 6000 -Patient was given several liters of fluid during his hospital course at [facility], does appear to be volume overloaded at this time. -He was started on Lasix -Will obtain echocardiogram, continue Lasix IV, monitor his input as well as output and daily weights -Pending results of patient's echocardiogram, may consult cardiology  Sepsis secondary to pneumonia versus urinary tract infection -Patient initially presented with leukocytosis and was febrile to [facility] -Will continue treatment plan as above -will obtain UA and culture as well as chest x-ray  Atrial fibrillation -Patient is not anticoagulation -Patient does have pacemaker  Dementia -Will continue patient on Namenda -hold Exelon patch -Appears to be at baseline per family  Chronic kidney disease, Stage III -patient's creatinine was 1.7 at [facility] -will obtain a CMP and monitor carefully as patient will be on diuretics -Last creatinine here at [facility] was noted in 2009 to be 1.5  History renal artery stenosis -Stable, status post stenting  History of BPH -continue Flomax  Normocytic anemia -last hemoglobin noted at [facility] was 10.5, last hemoglobin in our system was 12.2 in 2009 -Possibly dilutional component -Will continue to monitor CBC  Hyperglycemia -patient does not have a history of diabetes mellitus -Will obtain hemoglobin A1c -will place him on incentive insulin sliding scale CBG monitoring  Hypertension -Continue amlodipine  DVT prophylaxis: Lovenox  Code Status: Full  Condition: guarded  Family Communication: family at bedside. Admission, patients condition and plan of care including tests being ordered have been discussed with the patient and family who indicate understanding and agree with the plan and Code Status.  Disposition Plan: admitted   Time spent: 60 minutes  Ejay Lashley D.O. [facility] Pager  [phone]  If 7PM-7AM, please contact night-coverage [email or web address] Password [email or web address] 05/25/2014, 5:17 PM

## 2014-05-25 NOTE — Progress Notes (Signed)
ANTIBIOTIC CONSULT NOTE - INITIAL  Pharmacy Consult for vancomycin and zosyn Indication: pneumonia  Allergies  Allergen Reactions  . Aricept [Donepezil Hcl] Other (See Comments)    Lost weight, no appetite.  . Codeine Other (See Comments)    Unknown reaction    Patient Measurements: Height: 5\' 9"  (175.3 cm) Weight: 164 lb 14.5 oz (74.8 kg) IBW/kg (Calculated) : 70.7 Adjusted Body Weight:   Vital Signs: Temp: 98.1 F (36.7 C) (11/07 1620) Temp Source: Oral (11/07 1620) BP: 149/72 mmHg (11/07 1620) Pulse Rate: 105 (11/07 1620) Intake/Output from previous day:   Intake/Output from this shift:    Labs: No results for input(s): WBC, HGB, PLT, LABCREA, CREATININE in the last 72 hours. Estimated Creatinine Clearance: 38 mL/min (by C-G formula based on Cr of 1.5). No results for input(s): VANCOTROUGH, VANCOPEAK, VANCORANDOM, GENTTROUGH, GENTPEAK, GENTRANDOM, TOBRATROUGH, TOBRAPEAK, TOBRARND, AMIKACINPEAK, AMIKACINTROU, AMIKACIN in the last 72 hours.   Microbiology: No results found for this or any previous visit (from the past 720 hour(s)).  Medical History: No past medical history on file.  Medications:  Scheduled:  . amLODipine  10 mg Oral Daily  . dextromethorphan-guaiFENesin  1 tablet Oral BID  . enoxaparin (LOVENOX) injection  40 mg Subcutaneous Q24H  . furosemide  60 mg Intravenous Q12H  . ipratropium-albuterol  3 mL Nebulization Q4H  . memantine  10 mg Oral BID  . pantoprazole  40 mg Oral Daily  . sodium chloride  3 mL Intravenous Q12H  . tamsulosin  0.4 mg Oral QPC supper   Infusions:   Assessment: 80 yo male with pneumonia who was [facility] from [facility] will be switched to vancomycin and continued on zosyn therapy.  At [facility], his WBC was 10.9 and SCr was 1.7 (CrCl ~34); he was on levaquin and zosyn there.  Per [facility] pharmacy, last dose of zosyn 3.375g was @ 1000 on 11/07  Goal of Therapy:  Vancomycin trough level 15-20  mcg/ml  Plan:  - zosyn 3.375 g iv q8h (4hr infusion), 1st dose now - vancomycin 1g iv q24h - monitor renal function closely - follow up cx if available - check vancomycin trough when it's appropriate  Callahan Wild, Tsz-Yin 05/25/2014,6:27 PM

## 2014-05-25 NOTE — H&P (Signed)
  Pt being transferred [facility] for evaluation and management of acute respiratory failure. PCCM team called but it was determined that pt stable for admission to SDU under TRH team. Consult PCCM if needed once TRH evaluates. Spoke with Dr. Dalbert Mayotteamaswammy. Accepted transfer.  Debbora PrestoMAGICK-MYERS, ISKRA, MD  [facility] Pager [phone] Cell [phone]  If 7PM-7AM, please contact night-coverage [email or web address] Password [email or web address]

## 2014-05-26 ENCOUNTER — Inpatient Hospital Stay (HOSPITAL_COMMUNITY): Payer: Medicare Other

## 2014-05-26 DIAGNOSIS — N183 Chronic kidney disease, stage 3 unspecified: Secondary | ICD-10-CM | POA: Diagnosis present

## 2014-05-26 DIAGNOSIS — E785 Hyperlipidemia, unspecified: Secondary | ICD-10-CM

## 2014-05-26 DIAGNOSIS — R7989 Other specified abnormal findings of blood chemistry: Secondary | ICD-10-CM | POA: Diagnosis present

## 2014-05-26 DIAGNOSIS — N400 Enlarged prostate without lower urinary tract symptoms: Secondary | ICD-10-CM

## 2014-05-26 DIAGNOSIS — R739 Hyperglycemia, unspecified: Secondary | ICD-10-CM

## 2014-05-26 DIAGNOSIS — R0602 Shortness of breath: Secondary | ICD-10-CM | POA: Insufficient documentation

## 2014-05-26 DIAGNOSIS — I359 Nonrheumatic aortic valve disorder, unspecified: Secondary | ICD-10-CM

## 2014-05-26 DIAGNOSIS — R799 Abnormal finding of blood chemistry, unspecified: Secondary | ICD-10-CM

## 2014-05-26 DIAGNOSIS — R918 Other nonspecific abnormal finding of lung field: Secondary | ICD-10-CM

## 2014-05-26 DIAGNOSIS — J9601 Acute respiratory failure with hypoxia: Secondary | ICD-10-CM

## 2014-05-26 LAB — GLUCOSE, CAPILLARY
GLUCOSE-CAPILLARY: 141 mg/dL — AB (ref 70–99)
GLUCOSE-CAPILLARY: 129 mg/dL — AB (ref 70–99)
Glucose-Capillary: 100 mg/dL — ABNORMAL HIGH (ref 70–99)

## 2014-05-26 LAB — CBC
HCT: 30.7 % — ABNORMAL LOW (ref 39.0–52.0)
HEMOGLOBIN: 10.6 g/dL — AB (ref 13.0–17.0)
MCH: 31 pg (ref 26.0–34.0)
MCHC: 34.5 g/dL (ref 30.0–36.0)
MCV: 89.8 fL (ref 78.0–100.0)
Platelets: 515 10*3/uL — ABNORMAL HIGH (ref 150–400)
RBC: 3.42 MIL/uL — ABNORMAL LOW (ref 4.22–5.81)
RDW: 13.2 % (ref 11.5–15.5)
WBC: 14.6 10*3/uL — AB (ref 4.0–10.5)

## 2014-05-26 LAB — BASIC METABOLIC PANEL
ANION GAP: 16 — AB (ref 5–15)
BUN: 27 mg/dL — ABNORMAL HIGH (ref 6–23)
CO2: 22 meq/L (ref 19–32)
Calcium: 9 mg/dL (ref 8.4–10.5)
Chloride: 97 mEq/L (ref 96–112)
Creatinine, Ser: 1.91 mg/dL — ABNORMAL HIGH (ref 0.50–1.35)
GFR calc Af Amer: 36 mL/min — ABNORMAL LOW (ref >90)
GFR calc non Af Amer: 31 mL/min — ABNORMAL LOW (ref >90)
Glucose, Bld: 166 mg/dL — ABNORMAL HIGH (ref 70–99)
Potassium: 4.4 mEq/L (ref 3.7–5.3)
SODIUM: 135 meq/L — AB (ref 137–147)

## 2014-05-26 MED ORDER — HALOPERIDOL LACTATE 5 MG/ML IJ SOLN
INTRAMUSCULAR | Status: AC
  Filled 2014-05-26: qty 1

## 2014-05-26 MED ORDER — HALOPERIDOL LACTATE 5 MG/ML IJ SOLN
2.5000 mg | Freq: Four times a day (QID) | INTRAMUSCULAR | Status: DC | PRN
  Administered 2014-05-26: 2.5 mg via INTRAMUSCULAR

## 2014-05-26 MED ORDER — RIVASTIGMINE 4.6 MG/24HR TD PT24
4.6000 mg | MEDICATED_PATCH | Freq: Every day | TRANSDERMAL | Status: DC
  Administered 2014-05-26 – 2014-05-29 (×4): 4.6 mg via TRANSDERMAL
  Filled 2014-05-26 (×5): qty 1

## 2014-05-26 MED ORDER — TECHNETIUM TO 99M ALBUMIN AGGREGATED
6.0000 | Freq: Once | INTRAVENOUS | Status: AC | PRN
  Administered 2014-05-26: 6 via INTRAVENOUS

## 2014-05-26 MED ORDER — METOPROLOL TARTRATE 25 MG PO TABS
25.0000 mg | ORAL_TABLET | Freq: Two times a day (BID) | ORAL | Status: DC
  Administered 2014-05-26 – 2014-05-30 (×9): 25 mg via ORAL
  Filled 2014-05-26 (×10): qty 1

## 2014-05-26 MED ORDER — INSULIN ASPART 100 UNIT/ML ~~LOC~~ SOLN
0.0000 [IU] | Freq: Three times a day (TID) | SUBCUTANEOUS | Status: DC
  Administered 2014-05-26 – 2014-05-27 (×3): 1 [IU] via SUBCUTANEOUS
  Administered 2014-05-28: 3 [IU] via SUBCUTANEOUS
  Administered 2014-05-28: 1 [IU] via SUBCUTANEOUS
  Administered 2014-05-29: 3 [IU] via SUBCUTANEOUS
  Administered 2014-05-29 (×2): 1 [IU] via SUBCUTANEOUS
  Administered 2014-05-30: 2 [IU] via SUBCUTANEOUS

## 2014-05-26 MED ORDER — TECHNETIUM TC 99M DIETHYLENETRIAME-PENTAACETIC ACID: 40.0000 | Freq: Once | INTRAVENOUS | Status: AC | PRN

## 2014-05-26 NOTE — Consult Note (Signed)
NAMTheotis Ford:  Ford, [name]                 ACCOUNT NO.:  [id number]  MEDICAL RECORD NO.:  [id number]  LOCATION:  3S10C                        FACILITY:  [facility]  PHYSICIAN:  Barbaraann ShareKeith M. Clance, MD,FCCPDATE OF BIRTH:  02[birth year]14[birth year]1932  DATE OF CONSULTATION:  05/26/2014 DATE OF DISCHARGE:                                CONSULTATION   REFERRING PHYSICIAN:  [facility].  HISTORY OF PRESENT ILLNESS:  The patient is an 80 year old male, who I have been asked to see for acute respiratory failure.  The patient has known underlying dementia, and was recently admitted to [facility] with a possible UTI.  He was treated with antibiotics, but ultimately felt to possibly have pneumonia.  Because the family was not happy with his clinical progress there, he has been transferred to come to hospital for further treatment.  The patient on arrival here was afebrile, and his chest x-ray showed increased lower lobe densities that appeared most consistent with layering pleural effusions posteriorly. He has had some cough according to nursing, but it is primarily been from the upper airway.  He is currently in the respiratory distress, on 4 L of nasal oxygen with adequate oxygen desaturations.  He was on bilevel transiently, but clearly being over ventilated by his arterial blood gas.  The patient also has a known history of chronic renal insufficiency and atrial fibrillation, and was found to have a very elevated BNP on admission.  He is currently being treated with IV vancomycin and Zosyn.  PAST MEDICAL HISTORY:  Obtained from the chart, and is positive for atrial fibrillation, underlying dementia, and chronic renal insufficiency.  Further history is unable to obtained from the patient because of the underlying dementia, and there is no family available.  ALLERGIES:  The patient is allergic to codeine.  SOCIAL HISTORY:  Obtained from the chart shows no history of alcohol or tobacco use.  FAMILY  HISTORY:  Shows a history of stroke in his mother.  REVIEW OF SYSTEMS:  Unable to be obtained, because of the patient's underlying dementia.  PHYSICAL EXAMINATION:  GENERAL:  He is a well-developed male in no acute distress. VIAL SIGNS:  Blood pressure 146/49, pulse 94, respiratory rate 15.  He is afebrile, O2 saturation on 4 L is 97%. HEENT:  Pupils equal, round, reactive to light, accommodation.  Most are intact.  Nares are patent without discharge.  Oropharynx is clear. NECK:  Supple without JVD or lymphadenopathy.  There is no thyromegaly. CHEST:  Reveals mild basilar crackles posteriorly, but totally clear to auscultation elsewhere. CARDIAC:  Irregular rhythm, but controlled ventricular response. ABDOMEN:  Soft, but mildly protuberant, nontender.  Good bowel sounds. GENITAL/ RECTAL/ BREAST:  Not indicated. EXTREMITIES:  Lower extremities with no significant edema.  There is no cyanosis. NEUROLOGICAL:  He is alert but confused.  He can answer some questions appropriately.  He moves all 4 extremities.  IMPRESSION:  Acute hypoxemic respiratory failure secondary to either acute congestive heart failure, possibly aspiration pneumonia, or both. It is very difficult to tell if he even has an underlying pulmonary infection at this time.  He clearly has bilateral effusions and worsening renal insufficiency.  I totally agree with an  echocardiogram and also gentle diuresis.  Because he is at higher risk for aspiration, I would also treat him empirically for possible pneumonia.  The patient currently is in no respiratory distress, calm, and has excellent oxygen desaturations.  SUGGESTIONS: 1. Continue IV antibiotics and follow up cultures. 2. Agree with nebulized bronchodilators to help with mucociliary     clearance. 3. Agree with echocardiogram and gentle diuresis. 4. Wean oxygen as tolerated. 5. The patient appears to be fairly stable at this time, and there is     really nothing  else to add from a pulmonary standpoint.  Please     call if further assistance is required.     Barbaraann ShareKeith M. Clance, MD,FCCP     [facility]/MEDQ  D:  05/26/2014  T:  05/26/2014  Job:  [id number]

## 2014-05-26 NOTE — Progress Notes (Signed)
[facility]                                                                              Patient Demographics  [name], is a 80 y.o. male, DOB - [birth year], ZOX:[id number]  Admit date - 05/25/2014   Admitting Physician Costin Otelia Sergeant, MD  Outpatient Primary MD for the patient is Oliver Barre, MD  LOS - 1   No chief complaint on file.     HPI on 05/25/2014 [name] is a 80 y.o. male with a history of atrial fibrillation, hypertension, dementia on no anticoagulation, recent hospitalization and transfer from [facility] to [facility] for failure to improve. Patient presented to [facility] for suspected urinary tract infection. Patient was placed on Levaquin and ceftriaxone. He then proceeded to develop pneumonia and was placed on Levaquin and Zosyn. Patient required BiPAP as well as 4 L of nasal cannula. Patient does not wear or use oxygen at home. Per family request, patient was transferred to [facility]. It appears that patient had been given plenty of fluids during his hospitalization for sepsis at [facility]. Echocardiogram was not obtained however patient did have elevated BNP. At the present admission, patient has no complaints. Per family patient has had shortness of breath, cough, some abdominal distention and pain. He did have a bowel movement earlier this morning.  Assessment & Plan   Acute respiratory failure -Patient was transferred from [facility] to [facility] -Patient was initially admitted to [facility] with pneumonia however failed to improve -possibly complicated by non-resolving pneumonia versus new diagnosis of CHF and atrial fibrillation -Currently requiring 4 L oxygen via nasal cannula as well as BiPAP at night -Patient's family requested pulmonology consult, pending -Continue vancomycin and Zosyn, DuoNeb treatments, guaifenesin -CXR: small b/l pleural effusion L>R, no convincing pulmonary edema, ?infiltrate left lung  base -Will obtain V/Q scan to evaluate for PE (patient's creatinine 1.9, therefore cannot obtain CTA)  Elevated BNP -Patient does not have a formal diagnosis of heart failure however BNP noted I [facility] hospital was over 6000 -Patient was given several liters of fluid during his hospital course at [facility], does appear to be volume overloaded at this time. -Will obtain echocardiogram, continue Lasix IV, monitor his input as well as output and daily weights -Patient had >1100 UO in less than 24 hours -Pending results of patient's echocardiogram, may consult cardiology  Sepsis secondary to pneumonia versus urinary tract infection -Patient initially presented with leukocytosis and was febrile to [facility] -Will continue treatment plan as above -CXR notes possible infiltrate, patient does have leukocytosis however remains afebrile -Pending UA  Atrial fibrillation -Patient is not anticoagulation -Patient does have pacemaker -will start patient on metoprolol -TSH 0.81  Dementia -Will continue patient on Namenda and Exelon patch -Appears to be at baseline per family  Chronic kidney disease, Stage III -Creatinine increased slightly to 1.9, however GFR stable -Last creatinine here at [name] was noted in 2009 to be 1.5 -Continue monitor BMP carefully as patient will be on diuretics  History renal artery stenosis -Stable, status post stenting  History of BPH -continue Flomax  Normocytic anemia -Hb remains stable at 10.6 -Will continue  to monitor CBC  Hyperglycemia -patient does not have a history of diabetes mellitus -Will obtain hemoglobin A1c -will place him on incentive insulin sliding scale CBG monitoring  Hypertension -Continue amlodipine  Mildly Elevated LFTs -Will continue to monitor CMP  Code Status: DNR  Family Communication: None at bedside  Disposition Plan: Admitted  Time Spent in minutes   30 minutes  Procedures  Echocardiogram  Consults    Pulmonology  DVT Prophylaxis  Lovenox  Lab Results  Component Value Date   PLT 515* 05/26/2014    Medications  Scheduled Meds: . amLODipine  10 mg Oral Daily  . dextromethorphan-guaiFENesin  1 tablet Oral BID  . enoxaparin (LOVENOX) injection  40 mg Subcutaneous Q24H  . furosemide  60 mg Intravenous Q12H  . insulin aspart  0-9 Units Subcutaneous TID WC  . ipratropium-albuterol  3 mL Nebulization Q4H  . memantine  10 mg Oral BID  . pantoprazole  40 mg Oral Daily  . piperacillin-tazobactam (ZOSYN)  IV  3.375 g Intravenous Q8H  . sodium chloride  3 mL Intravenous Q12H  . tamsulosin  0.4 mg Oral QPC supper  . vancomycin  1,000 mg Intravenous Q24H   Continuous Infusions:  PRN Meds:.sodium chloride, acetaminophen **OR** acetaminophen, sodium chloride  Antibiotics    Anti-infectives    Start     Dose/Rate Route Frequency Ordered Stop   05/25/14 1930  vancomycin (VANCOCIN) IVPB 1000 mg/200 mL premix     1,000 mg200 mL/hr over 60 Minutes Intravenous Every 24 hours 05/25/14 1840     05/25/14 1930  piperacillin-tazobactam (ZOSYN) IVPB 3.375 g     3.375 g12.5 mL/hr over 240 Minutes Intravenous Every 8 hours 05/25/14 1842          Subjective:   [name] seen and examined today.  Patient has dementia.  Currently denies any pain and states he wants to go back to sleep.    Objective:   Filed Vitals:   05/26/14 0343 05/26/14 0346 05/26/14 0800 05/26/14 0823  BP:  159/59 146/49   Pulse:  106 94   Temp:  97.6 F (36.4 C)    TempSrc:  Axillary    Resp:  21 15   Height:      Weight: 70.8 kg (156 lb 1.4 oz)     SpO2:  96% 97% 97%    Wt Readings from Last 3 Encounters:  05/26/14 70.8 kg (156 lb 1.4 oz)  05/08/08 69.31 kg (152 lb 12.8 oz)     Intake/Output Summary (Last 24 hours) at 05/26/14 1006 Last data filed at 05/26/14 0031  Gross per 24 hour  Intake      0 ml  Output   1125 ml  Net  -1125 ml    Exam  General: Well developed, well nourished, NAD, appears  stated age  HEENT: NCAT, mucous membranes moist.   Cardiovascular: S1 S2 auscultated, irregular, 2/6 SEM  Respiratory: Clear to auscultation bilaterally with equal chest rise  Abdomen: Soft, nontender, nondistended, + bowel sounds  Extremities: warm dry without cyanosis clubbing or edema. Edema in LUE resolved  Neuro: AAO to self.  No focal deficits  Psych: Appropriate  Data Review   Micro Results Recent Results (from the past 240 hour(s))  MRSA PCR Screening     Status: None   Collection Time: 05/25/14  6:23 PM  Result Value Ref Range Status   MRSA by PCR NEGATIVE NEGATIVE Final    Comment:        The GeneXpert  MRSA Assay (FDA approved for NASAL specimens only), is one component of a comprehensive MRSA colonization surveillance program. It is not intended to diagnose MRSA infection nor to guide or monitor treatment for MRSA infections.     Radiology Reports Dg Chest Port 1 View  05/25/2014   CLINICAL DATA:  Short of breath.  EXAM: PORTABLE CHEST - 1 VIEW  COMPARISON:  05/23/2014  FINDINGS: There is hazy opacity at both lung bases, obscuring the left hemidiaphragm, consistent with small bilateral effusions. No overt pulmonary edema. Additional opacity at the left base is likely atelectasis. Infiltrate is possible. No pneumothorax.  Cardiac silhouette is normal in size. No mediastinal or hilar masses.  Left anterior chest wall sequential pacemaker is stable.  IMPRESSION: 1. Small bilateral effusions, larger on the left. 2. No convincing pulmonary edema. 3. Additional left lung base opacity is likely atelectasis. Infiltrate is possible.   Electronically Signed   By: Amie Portlandavid  Ormond M.D.   On: 05/25/2014 21:11    CBC  Recent Labs Lab 05/25/14 1947 05/26/14 0328  WBC 14.1* 14.6*  HGB 10.8* 10.6*  HCT 32.2* 30.7*  PLT 534* 515*  MCV 89.7 89.8  MCH 30.1 31.0  MCHC 33.5 34.5  RDW 13.3 13.2  LYMPHSABS 0.9  --   MONOABS 0.9  --   EOSABS 0.0  --   BASOSABS 0.0  --      Chemistries   Recent Labs Lab 05/25/14 1947 05/26/14 0328  NA 135* 135*  K 4.5 4.4  CL 97 97  CO2 20 22  GLUCOSE 158* 166*  BUN 23 27*  CREATININE 1.70* 1.91*  CALCIUM 9.3 9.0  MG 1.9  --   AST 66*  --   ALT 73*  --   ALKPHOS 58  --   BILITOT 0.5  --    ------------------------------------------------------------------------------------------------------------------ estimated creatinine clearance is 29.8 mL/min (by C-G formula based on Cr of 1.91). ------------------------------------------------------------------------------------------------------------------ No results for input(s): HGBA1C in the last 72 hours. ------------------------------------------------------------------------------------------------------------------  Recent Labs  05/25/14 1947  CHOL 147  HDL 40  LDLCALC 92  TRIG 74  CHOLHDL 3.7   ------------------------------------------------------------------------------------------------------------------  Recent Labs  05/25/14 1947  TSH 0.810   ------------------------------------------------------------------------------------------------------------------ No results for input(s): VITAMINB12, FOLATE, FERRITIN, TIBC, IRON, RETICCTPCT in the last 72 hours.  Coagulation profile No results for input(s): INR, PROTIME in the last 168 hours.  No results for input(s): DDIMER in the last 72 hours.  Cardiac Enzymes No results for input(s): CKMB, TROPONINI, MYOGLOBIN in the last 168 hours.  Invalid input(s): CK ------------------------------------------------------------------------------------------------------------------ Invalid input(s): POCBNP    Disney Ruggiero D.O. on 05/26/2014 at 10:06 AM  Between 7am to 7pm - Pager - [phone]  After 7pm go to [email or web address] - password [email or web address]  And look for the night coverage person covering for me after hours  [facility] Office  [phone]

## 2014-05-26 NOTE — Consult Note (Signed)
Dictation #:  [id number]

## 2014-05-26 NOTE — Progress Notes (Signed)
  Echocardiogram 2D Echocardiogram has been performed.  [name], [name] 05/26/2014, 10:46 AM

## 2014-05-26 NOTE — Progress Notes (Signed)
Unable to place Pt on BiPAP at this time due mental status and need of restraints.

## 2014-05-26 NOTE — Progress Notes (Signed)
Pt began to become very agitated, trying to climb out of bed. Patient safety became a concern. Dr. Catha GosselinMikhail called at 1745 and order received. Medication administered but patient continued to be very aggressive. Bilateral wrist restraints were then applied at 1830 as a safety measure. Lenny Pastelom Callahan NP notified and order for restraints obtained. Will continue to monitor.

## 2014-05-27 LAB — URINALYSIS, ROUTINE W REFLEX MICROSCOPIC
BILIRUBIN URINE: NEGATIVE
GLUCOSE, UA: NEGATIVE mg/dL
Ketones, ur: NEGATIVE mg/dL
Leukocytes, UA: NEGATIVE
Nitrite: NEGATIVE
PH: 6 (ref 5.0–8.0)
Protein, ur: NEGATIVE mg/dL
SPECIFIC GRAVITY, URINE: 1.013 (ref 1.005–1.030)
Urobilinogen, UA: 0.2 mg/dL (ref 0.0–1.0)

## 2014-05-27 LAB — HEMOGLOBIN A1C
HEMOGLOBIN A1C: 5.9 % — AB (ref <5.7)
MEAN PLASMA GLUCOSE: 123 mg/dL — AB (ref <117)

## 2014-05-27 LAB — CBC
HEMATOCRIT: 35.4 % — AB (ref 39.0–52.0)
Hemoglobin: 12.1 g/dL — ABNORMAL LOW (ref 13.0–17.0)
MCH: 30.6 pg (ref 26.0–34.0)
MCHC: 34.2 g/dL (ref 30.0–36.0)
MCV: 89.4 fL (ref 78.0–100.0)
Platelets: 660 10*3/uL — ABNORMAL HIGH (ref 150–400)
RBC: 3.96 MIL/uL — ABNORMAL LOW (ref 4.22–5.81)
RDW: 13.3 % (ref 11.5–15.5)
WBC: 15.2 10*3/uL — AB (ref 4.0–10.5)

## 2014-05-27 LAB — BASIC METABOLIC PANEL
Anion gap: 17 — ABNORMAL HIGH (ref 5–15)
BUN: 42 mg/dL — AB (ref 6–23)
CO2: 26 mEq/L (ref 19–32)
Calcium: 9 mg/dL (ref 8.4–10.5)
Chloride: 95 mEq/L — ABNORMAL LOW (ref 96–112)
Creatinine, Ser: 2.28 mg/dL — ABNORMAL HIGH (ref 0.50–1.35)
GFR calc Af Amer: 29 mL/min — ABNORMAL LOW (ref >90)
GFR calc non Af Amer: 25 mL/min — ABNORMAL LOW (ref >90)
GLUCOSE: 106 mg/dL — AB (ref 70–99)
POTASSIUM: 3.7 meq/L (ref 3.7–5.3)
Sodium: 138 mEq/L (ref 137–147)

## 2014-05-27 LAB — GLUCOSE, CAPILLARY
GLUCOSE-CAPILLARY: 122 mg/dL — AB (ref 70–99)
Glucose-Capillary: 123 mg/dL — ABNORMAL HIGH (ref 70–99)
Glucose-Capillary: 138 mg/dL — ABNORMAL HIGH (ref 70–99)

## 2014-05-27 LAB — URINE MICROSCOPIC-ADD ON

## 2014-05-27 MED ORDER — SODIUM CHLORIDE 0.9 % IV SOLN
INTRAVENOUS | Status: AC
  Administered 2014-05-27: 12:00:00 via INTRAVENOUS

## 2014-05-27 MED ORDER — LORAZEPAM 2 MG/ML IJ SOLN: 0.5000 mg | Freq: Four times a day (QID) | INTRAMUSCULAR | Status: DC | PRN

## 2014-05-27 MED ORDER — ENOXAPARIN SODIUM 30 MG/0.3ML ~~LOC~~ SOLN
30.0000 mg | SUBCUTANEOUS | Status: DC
  Administered 2014-05-27 – 2014-05-29 (×3): 30 mg via SUBCUTANEOUS
  Filled 2014-05-27 (×4): qty 0.3

## 2014-05-27 MED ORDER — IPRATROPIUM-ALBUTEROL 0.5-2.5 (3) MG/3ML IN SOLN
3.0000 mL | Freq: Two times a day (BID) | RESPIRATORY_TRACT | Status: DC
  Administered 2014-05-27 – 2014-05-28 (×2): 3 mL via RESPIRATORY_TRACT
  Filled 2014-05-27 (×3): qty 3

## 2014-05-27 NOTE — Progress Notes (Signed)
Utilization review completed.  

## 2014-05-27 NOTE — Progress Notes (Signed)
Report called in to RN on 5W. Pt's family at bedside and aware of transfer. VS stable, no s/s of acute distress noted.

## 2014-05-27 NOTE — Progress Notes (Addendum)
[facility]                                                                              Patient Demographics  [name], is a 81 y.o. male, DOB - [birth year], ZOX:[id number]  Admit date - 05/25/2014   Admitting Physician Costin Otelia Sergeant, MD  Outpatient Primary MD for the patient is Oliver Barre, MD  LOS - 2   No chief complaint on file.     HPI on 05/25/2014 [name] is a 80 y.o. male with a history of atrial fibrillation, hypertension, dementia on no anticoagulation, recent hospitalization and transfer from [facility] to [facility] for failure to improve. Patient presented to [facility] for suspected urinary tract infection. Patient was placed on Levaquin and ceftriaxone. He then proceeded to develop pneumonia and was placed on Levaquin and Zosyn. Patient required BiPAP as well as 4 L of nasal cannula. Patient does not wear or use oxygen at home. Per family request, patient was transferred to [facility]. It appears that patient had been given plenty of fluids during his hospitalization for sepsis at [facility]. Echocardiogram was not obtained however patient did have elevated BNP. At the present admission, patient has no complaints. Per family patient has had shortness of breath, cough, some abdominal distention and pain. He did have a bowel movement earlier this morning.  Assessment & Plan   Acute respiratory failure -Improving -Patient was transferred from [facility] to [facility] -Patient was initially admitted to [facility] with pneumonia however failed to improve -possibly complicated by non-resolving pneumonia versus atrial fibrillation -Initially required 4L of oxygen and BiPAP at night. -Currently requiring 2 L oxygen via nasal cannula, oxygen saturations remain stable above 92% -Pulmonology consulted and appreciated, currently agree with management. -Continue vancomycin and Zosyn, DuoNeb treatments, guaifenesin -CXR: small b/l  pleural effusion L>R, no convincing pulmonary edema, ?infiltrate left lung base -VQ scan: right lower lobe atelectasis or pneumonia matching ventilation/perfusion defects, no evidence of pulmonary embolism.  Elevated BNP -Patient appears euvolemic at this time -Patient does not have a formal diagnosis of heart failure however BNP noted I [facility] hospital was over 6000 -Patient was given several liters of fluid during his hospital course at [facility], does appear to be volume overloaded at this time. -Echocardiogram: EF 60-65%, no mention of diastolic dysfunction -Given his AKI will stop lasix at this time  Sepsis secondary to pneumonia versus urinary tract infection -Patient initially presented with leukocytosis and was febrile to [facility] -Will continue treatment plan as above -CXR notes possible infiltrate, patient does have leukocytosis however remains afebrile -Pending UA  Atrial fibrillation -Patient is not anticoagulation -Patient does have pacemaker -will start patient on metoprolol -TSH 0.81  Dementia -Will continue patient on Namenda and Exelon patch -Appears to be at baseline per family  Acute on Chronic kidney disease, Stage III -Creatinine increased slightly to 2.28 -Likely secondary to lasix, will discontinue lasix and continue to monitor -Last creatinine here at [name] was noted in 2009 to be 1.5 -Will start gentle IV hydration (labs look hemoconcentrated)  History renal artery stenosis -Stable, status post stenting  History of BPH -continue Flomax  Normocytic anemia -Hb  remains stable -Hb today 12.1 (likely hem concentrated) -Will continue to monitor CBC  Hyperglycemia -patient does not have a history of diabetes mellitus -Hemoglobin A1c 5.9 -Continue sensitive insulin sliding scale CBG monitoring  Hypertension -Continue amlodipine  Mildly Elevated LFTs -Will continue to monitor CMP  Code Status: DNR  Family Communication: None at  bedside  Disposition Plan: Admitted, if patient remains stable, will transfer to CHF floor  Time Spent in minutes   30 minutes  Procedures  Echocardiogram EF 60-65%  Consults   Pulmonology  DVT Prophylaxis  Lovenox  Lab Results  Component Value Date   PLT 660* 05/27/2014    Medications  Scheduled Meds: . amLODipine  10 mg Oral Daily  . dextromethorphan-guaiFENesin  1 tablet Oral BID  . enoxaparin (LOVENOX) injection  40 mg Subcutaneous Q24H  . furosemide  60 mg Intravenous Q12H  . insulin aspart  0-9 Units Subcutaneous TID WC  . ipratropium-albuterol  3 mL Nebulization BID  . memantine  10 mg Oral BID  . metoprolol tartrate  25 mg Oral BID  . pantoprazole  40 mg Oral Daily  . piperacillin-tazobactam (ZOSYN)  IV  3.375 g Intravenous Q8H  . rivastigmine  4.6 mg Transdermal QHS  . sodium chloride  3 mL Intravenous Q12H  . tamsulosin  0.4 mg Oral QPC supper  . vancomycin  1,000 mg Intravenous Q24H   Continuous Infusions:  PRN Meds:.sodium chloride, acetaminophen **OR** acetaminophen, haloperidol lactate, sodium chloride  Antibiotics    Anti-infectives    Start     Dose/Rate Route Frequency Ordered Stop   05/25/14 1930  vancomycin (VANCOCIN) IVPB 1000 mg/200 mL premix     1,000 mg200 mL/hr over 60 Minutes Intravenous Every 24 hours 05/25/14 1840     05/25/14 1930  piperacillin-tazobactam (ZOSYN) IVPB 3.375 g     3.375 g12.5 mL/hr over 240 Minutes Intravenous Every 8 hours 05/25/14 1842          Subjective:   Ian BogaClifton Ford seen and examined today.  Patient has dementia.  Currently denies any pain, shortness of breath, chest pain, abdominal pain.  Objective:   Filed Vitals:   05/27/14 0500 05/27/14 0700 05/27/14 0723 05/27/14 0822  BP:   160/93   Pulse:    80  Temp:  97.9 F (36.6 C)    TempSrc:  Oral    Resp:   29 22  Height:      Weight: 69.8 kg (153 lb 14.1 oz)     SpO2:   100% 100%    Wt Readings from Last 3 Encounters:  05/27/14 69.8 kg (153 lb  14.1 oz)  05/08/08 69.31 kg (152 lb 12.8 oz)     Intake/Output Summary (Last 24 hours) at 05/27/14 1116 Last data filed at 05/26/14 1600  Gross per 24 hour  Intake    320 ml  Output      0 ml  Net    320 ml    Exam  General: Well developed, well nourished, NAD, appears stated age  HEENT: NCAT, mucous membranes moist.   Cardiovascular: S1 S2 auscultated, irregular, 2/6 SEM  Respiratory: Clear to auscultation bilaterally with equal chest rise  Abdomen: Soft, nontender, nondistended, + bowel sounds  Extremities: warm dry without cyanosis clubbing or edema. Edema in LUE resolved  Neuro: AAO to self.  No focal deficits  Psych: Appropriate  Data Review   Micro Results Recent Results (from the past 240 hour(s))  MRSA PCR Screening     Status: None  Collection Time: 05/25/14  6:23 PM  Result Value Ref Range Status   MRSA by PCR NEGATIVE NEGATIVE Final    Comment:        The GeneXpert MRSA Assay (FDA approved for NASAL specimens only), is one component of a comprehensive MRSA colonization surveillance program. It is not intended to diagnose MRSA infection nor to guide or monitor treatment for MRSA infections.     Radiology Reports Nm Pulmonary Perf And Vent  05/26/2014   CLINICAL DATA:  Shortness of breath. Unable to obtain lateral views due to patient limitations.  EXAM: NUCLEAR MEDICINE VENTILATION - PERFUSION LUNG SCAN  TECHNIQUE: Ventilation images were obtained in multiple projections using inhaled aerosol technetium 99 M DTPA. Perfusion images were obtained in multiple projections after intravenous injection of Tc-12m MAA.  RADIOPHARMACEUTICALS:  Forty mCi Tc-52m DTPA aerosol and 6 mCi Tc-58m MAA  COMPARISON:  Portable chest obtained yesterday.  FINDINGS: Ventilation: Pacemaker defect. Weight shaped area of lack of ventilation in the posterior aspect of the right lower lobe with corresponding airspace opacity the on the recent portable chest radiograph. Otherwise,  normal ventilation of both lungs.  Perfusion: Pacemaker defect. With shaped area of lack of perfusion in the posterior aspect of the right lower lobe, corresponding to an area of lack of ventilation and airspace opacity. No perfusion defects to suggest acute pulmonary embolism.  IMPRESSION: Right lower lobe atelectasis or pneumonia with matching ventilation and perfusion defects. No evidence of pulmonary embolism.   Electronically Signed   By: Gordan Payment M.D.   On: 05/26/2014 13:35   Dg Chest Port 1 View  05/25/2014   CLINICAL DATA:  Short of breath.  EXAM: PORTABLE CHEST - 1 VIEW  COMPARISON:  05/23/2014  FINDINGS: There is hazy opacity at both lung bases, obscuring the left hemidiaphragm, consistent with small bilateral effusions. No overt pulmonary edema. Additional opacity at the left base is likely atelectasis. Infiltrate is possible. No pneumothorax.  Cardiac silhouette is normal in size. No mediastinal or hilar masses.  Left anterior chest wall sequential pacemaker is stable.  IMPRESSION: 1. Small bilateral effusions, larger on the left. 2. No convincing pulmonary edema. 3. Additional left lung base opacity is likely atelectasis. Infiltrate is possible.   Electronically Signed   By: Amie Portland M.D.   On: 05/25/2014 21:11    CBC  Recent Labs Lab 05/25/14 1947 05/26/14 0328 05/27/14 0245  WBC 14.1* 14.6* 15.2*  HGB 10.8* 10.6* 12.1*  HCT 32.2* 30.7* 35.4*  PLT 534* 515* 660*  MCV 89.7 89.8 89.4  MCH 30.1 31.0 30.6  MCHC 33.5 34.5 34.2  RDW 13.3 13.2 13.3  LYMPHSABS 0.9  --   --   MONOABS 0.9  --   --   EOSABS 0.0  --   --   BASOSABS 0.0  --   --     Chemistries   Recent Labs Lab 05/25/14 1947 05/26/14 0328 05/27/14 0245  NA 135* 135* 138  K 4.5 4.4 3.7  CL 97 97 95*  CO2 20 22 26   GLUCOSE 158* 166* 106*  BUN 23 27* 42*  CREATININE 1.70* 1.91* 2.28*  CALCIUM 9.3 9.0 9.0  MG 1.9  --   --   AST 66*  --   --   ALT 73*  --   --   ALKPHOS 58  --   --   BILITOT 0.5  --    --    ------------------------------------------------------------------------------------------------------------------ estimated creatinine clearance is 24.7 mL/min (by C-G  formula based on Cr of 2.28). ------------------------------------------------------------------------------------------------------------------  Recent Labs  05/25/14 1947  HGBA1C 5.9*   ------------------------------------------------------------------------------------------------------------------  Recent Labs  05/25/14 1947  CHOL 147  HDL 40  LDLCALC 92  TRIG 74  CHOLHDL 3.7   ------------------------------------------------------------------------------------------------------------------  Recent Labs  05/25/14 1947  TSH 0.810   ------------------------------------------------------------------------------------------------------------------ No results for input(s): VITAMINB12, FOLATE, FERRITIN, TIBC, IRON, RETICCTPCT in the last 72 hours.  Coagulation profile No results for input(s): INR, PROTIME in the last 168 hours.  No results for input(s): DDIMER in the last 72 hours.  Cardiac Enzymes No results for input(s): CKMB, TROPONINI, MYOGLOBIN in the last 168 hours.  Invalid input(s): CK ------------------------------------------------------------------------------------------------------------------ Invalid input(s): POCBNP    Luian Schumpert D.O. on 05/27/2014 at 11:16 AM  Between 7am to 7pm - Pager - [phone]  After 7pm go to [email or web address] - password [email or web address]  And look for the night coverage person covering for me after hours  [facility] Office  [phone]

## 2014-05-27 NOTE — Progress Notes (Signed)
NURSING PROGRESS NOTE  [name] [id number] Transfer Data: 05/27/2014 4:10 PM Attending Provider: Edsel PetrinMaryann Mikhail, DO ZHY:[id number]:James [name], MD Code Status: DNR  Ian LameClifton [name] is a 80 y.o. male [facility]  -No acute distress noted.  -No complaints of shortness of breath.  -No complaints of chest pain.     Blood pressure 125/67, pulse 87, temperature 97.9 F (36.6 C), temperature source Oral, resp. rate 21, height 5\' 9"  (1.753 m), weight 69.8 kg (153 lb 14.1 oz), SpO2 97 %.   IV Fluids:  IV in place, occlusive dsg intact without redness, IV cath Right upper arm, NS at 8650ml/hour. Allergies:  Aricept and Codeine  Past Medical History:   has no past medical history on file.  Past Surgical History:   has no past surgical history on file.  Social History:     Skin: Skin tear noted to Right arm, cleaned, new foam applied.   Patient/Family orientated to room. Information packet given to patient/family. Admission inpatient armband information verified with patient/family to include name and date of birth and placed on patient arm. Side rails up x 2, fall assessment and education completed with patient/family. Patient/family able to verbalize understanding of risk associated with falls and verbalized understanding to call for assistance before getting out of bed. Call light within reach. Patient/family able to voice and demonstrate understanding of unit orientation instructions.    Will continue to evaluate and treat per MD orders.

## 2014-05-28 DIAGNOSIS — N179 Acute kidney failure, unspecified: Secondary | ICD-10-CM

## 2014-05-28 DIAGNOSIS — F0390 Unspecified dementia without behavioral disturbance: Secondary | ICD-10-CM | POA: Diagnosis present

## 2014-05-28 LAB — BASIC METABOLIC PANEL
ANION GAP: 17 — AB (ref 5–15)
BUN: 41 mg/dL — AB (ref 6–23)
CHLORIDE: 94 meq/L — AB (ref 96–112)
CO2: 26 mEq/L (ref 19–32)
Calcium: 8.7 mg/dL (ref 8.4–10.5)
Creatinine, Ser: 2.3 mg/dL — ABNORMAL HIGH (ref 0.50–1.35)
GFR calc non Af Amer: 25 mL/min — ABNORMAL LOW (ref >90)
GFR, EST AFRICAN AMERICAN: 29 mL/min — AB (ref >90)
Glucose, Bld: 113 mg/dL — ABNORMAL HIGH (ref 70–99)
POTASSIUM: 3.6 meq/L — AB (ref 3.7–5.3)
SODIUM: 137 meq/L (ref 137–147)

## 2014-05-28 LAB — CBC
HEMATOCRIT: 36.6 % — AB (ref 39.0–52.0)
Hemoglobin: 12.5 g/dL — ABNORMAL LOW (ref 13.0–17.0)
MCH: 30.5 pg (ref 26.0–34.0)
MCHC: 34.2 g/dL (ref 30.0–36.0)
MCV: 89.3 fL (ref 78.0–100.0)
Platelets: 680 10*3/uL — ABNORMAL HIGH (ref 150–400)
RBC: 4.1 MIL/uL — ABNORMAL LOW (ref 4.22–5.81)
RDW: 13.1 % (ref 11.5–15.5)
WBC: 14.1 10*3/uL — AB (ref 4.0–10.5)

## 2014-05-28 LAB — GLUCOSE, CAPILLARY
GLUCOSE-CAPILLARY: 134 mg/dL — AB (ref 70–99)
GLUCOSE-CAPILLARY: 153 mg/dL — AB (ref 70–99)
Glucose-Capillary: 115 mg/dL — ABNORMAL HIGH (ref 70–99)
Glucose-Capillary: 146 mg/dL — ABNORMAL HIGH (ref 70–99)
Glucose-Capillary: 204 mg/dL — ABNORMAL HIGH (ref 70–99)

## 2014-05-28 LAB — URINE CULTURE
COLONY COUNT: NO GROWTH
CULTURE: NO GROWTH

## 2014-05-28 MED ORDER — IPRATROPIUM-ALBUTEROL 0.5-2.5 (3) MG/3ML IN SOLN: 3.0000 mL | Freq: Four times a day (QID) | RESPIRATORY_TRACT | Status: DC | PRN

## 2014-05-28 MED ORDER — ENSURE COMPLETE PO LIQD
237.0000 mL | Freq: Two times a day (BID) | ORAL | Status: DC
  Administered 2014-05-28 – 2014-05-30 (×5): 237 mL via ORAL

## 2014-05-28 NOTE — Evaluation (Signed)
Physical Therapy Evaluation Patient Details Name: Ian LameClifton F Strength MRN: [id number] DOB: 1929-08-12 Today's Date: 05/28/2014   History of Present Illness  Pt is an 80 y.o. male with a history of a-fib, hypertension, dementia, on no anticoagulation, recent hospitalization and transfer [facility] to [facility] for failure to improve. Patient presented to [facility] for suspected UTI. He then proceeded to develop pneumonia. Patient required BiPAP as well as 4 L of nasal cannula. Patient does not wear or use oxygen at home. Per family request, patient was transferred to [facility].  Clinical Impression  Pt admitted with the above. Pt currently with functional limitations due to the deficits listed below (see PT Problem List).  At the time of PT eval pt the focus of session was to change linens and perform peri-care as pt had bowel incontinence in the bed. Pt demonstrated good rolling and overall bed mobility however pt appeared fatigued and further mobility was not attempted after pt was cleaned up. Pt was confused throughout session and was not able to provide any history. Pt will benefit from skilled PT to increase their independence and safety with mobility to allow discharge to the venue listed below. Recommending SNF at this time as it is unclear whether pt has assistance available at d/c. Will continue to follow and update d/c recommendations as appropriate.       Follow Up Recommendations SNF;Supervision/Assistance - 24 hour    Equipment Recommendations   (TBD)    Recommendations for Other Services       Precautions / Restrictions Precautions Precautions: Fall Restrictions Weight Bearing Restrictions: No      Mobility  Bed Mobility Overal bed mobility: Needs Assistance Bed Mobility: Rolling Rolling: Min guard         General bed mobility comments: Pt was able to perform bed mobility for peri-care and linen change without min guard assist. Pt required  verbal and tactile cueing to initiate movement.   Transfers                 General transfer comment: OOB transfers deferred. See general comments.  Ambulation/Gait                Stairs            Wheelchair Mobility    Modified Rankin (Stroke Patients Only)       Balance                                             Pertinent Vitals/Pain Pain Assessment: No/denies pain    Home Living Family/patient expects to be discharged to:: Private residence Living Arrangements: Spouse/significant other               Additional Comments: Pt unable to provide any information about home environment due to confusion. Information not found in chart review.     Prior Function           Comments: Pt unable to recall PLOF due to confusion. Information not found upon chart review.      Hand Dominance        Extremity/Trunk Assessment   Upper Extremity Assessment: Defer to OT evaluation           Lower Extremity Assessment: Difficult to assess due to impaired cognition (Pt with difficulty following commands for MMT)      Cervical /  Trunk Assessment: Kyphotic  Communication   Communication: HOH  Cognition Arousal/Alertness: Awake/alert Behavior During Therapy: WFL for tasks assessed/performed Overall Cognitive Status: History of cognitive impairments - at baseline Pt was oriented to self only. Pt required increased time to state his name.                    General Comments      Exercises        Assessment/Plan    PT Assessment Patient needs continued PT services  PT Diagnosis Generalized weakness   PT Problem List Decreased strength;Decreased range of motion;Decreased activity tolerance;Decreased balance;Decreased mobility;Decreased knowledge of use of DME;Decreased safety awareness;Decreased knowledge of precautions;Decreased cognition  PT Treatment Interventions DME instruction;Gait training;Stair  training;Functional mobility training;Therapeutic activities;Therapeutic exercise;Neuromuscular re-education;Patient/family education   PT Goals (Current goals can be found in the Care Plan section) Acute Rehab PT Goals Patient Stated Goal: Pt did not state goals. PT Goal Formulation: Patient unable to participate in goal setting Time For Goal Achievement: 06/11/14 Potential to Achieve Goals: Fair    Frequency Min 3X/week   Barriers to discharge        Co-evaluation               End of Session Equipment Utilized During Treatment: Oxygen Activity Tolerance: Patient limited by fatigue Patient left: in bed;with call bell/phone within reach;with bed alarm set Nurse Communication: Mobility status         Time: 1040-1058 PT Time Calculation (min) (ACUTE ONLY): 18 min   Charges:   PT Evaluation $Initial PT Evaluation Tier I: 1 Procedure PT Treatments $Therapeutic Activity: 8-22 mins   PT G Codes:          Conni SlipperKirkman, Augustina Braddock 05/28/2014, 1:01 PM   Conni SlipperLaura Kailynn Satterly, PT, DPT Acute Rehabilitation Services Pager: [phone]

## 2014-05-28 NOTE — Progress Notes (Signed)
INITIAL NUTRITION ASSESSMENT  DOCUMENTATION CODES Per approved criteria  -Not Applicable   INTERVENTION: Ensure Complete po BID, each supplement provides 350 kcal and 13 grams of protein  NUTRITION DIAGNOSIS: Inadequate oral intake related to dementia as evidenced by poor appetite.   Goal: Pt to meet >/= 90% of their estimated nutrition needs   Monitor:  Weight trend, po intake, acceptance of supplements  Reason for Assessment: Malnutrition Screening Tool and Low Braden Score  80 y.o. male  Admitting Dx: Acute respiratory failure  ASSESSMENT: 80 y.o. male with a history of atrial fibrillation, hypertension, dementia on no anticoagulation, recent hospitalization and [facility] for failure to improve. Patient [facility] for suspected urinary tract infection. Patient was placed on Levaquin and ceftriaxone. He then proceeded to develop pneumonia and was placed on Levaquin and Zosyn. Patient required BiPAP as well as 4 L of nasal cannula.  - Spoke with pt's daughter who says that pt eats well at home. He has dementia and is unable to tell if he is hungry or not. Daughter says that family members provide him with food throughout the day. He drinks Ensure at home (1/day). Daughter says that pt's appetite has been decreased since admission to the hospital. Will send nutritional supplements.  - Family reports no recent weight loss. - Mild muscle wasting of the temple and clavicles.   Height: Ht Readings from Last 1 Encounters:  05/25/14 5\' 9"  (1.753 m)    Weight: Wt Readings from Last 1 Encounters:  05/28/14 145 lb 4.5 oz (65.9 kg)    Ideal Body Weight: 70.7 kg  % Ideal Body Weight: 93%  Wt Readings from Last 10 Encounters:  05/28/14 145 lb 4.5 oz (65.9 kg)  05/08/08 152 lb 12.8 oz (69.31 kg)    Usual Body Weight: 145 lbs  % Usual Body Weight: 100%  BMI:  Body mass index is 21.44 kg/(m^2).  Estimated Nutritional  Needs: Kcal: 1500-1700 Protein: 85-100 g Fluid: 1.7 L/day  Skin: wound on arm  Diet Order: Diet Heart  EDUCATION NEEDS: -Education needs addressed   Intake/Output Summary (Last 24 hours) at 05/28/14 1349 Last data filed at 05/28/14 1111  Gross per 24 hour  Intake 1894.99 ml  Output   1000 ml  Net 894.99 ml    Last BM: 11/10   Labs:   Recent Labs Lab 05/25/14 1947 05/26/14 0328 05/27/14 0245 05/28/14 0616  NA 135* 135* 138 137  K 4.5 4.4 3.7 3.6*  CL 97 97 95* 94*  CO2 20 22 26 26   BUN 23 27* 42* 41*  CREATININE 1.70* 1.91* 2.28* 2.30*  CALCIUM 9.3 9.0 9.0 8.7  MG 1.9  --   --   --   PHOS 4.9*  --   --   --   GLUCOSE 158* 166* 106* 113*    CBG (last 3)   Recent Labs  05/28/14 0104 05/28/14 0813 05/28/14 1225  GLUCAP 134* 115* 146*    Scheduled Meds: . amLODipine  10 mg Oral Daily  . dextromethorphan-guaiFENesin  1 tablet Oral BID  . enoxaparin (LOVENOX) injection  30 mg Subcutaneous Q24H  . furosemide  60 mg Intravenous Q12H  . insulin aspart  0-9 Units Subcutaneous TID WC  . ipratropium-albuterol  3 mL Nebulization BID  . memantine  10 mg Oral BID  . metoprolol tartrate  25 mg Oral BID  . pantoprazole  40 mg Oral Daily  . piperacillin-tazobactam (ZOSYN)  IV  3.375 g Intravenous Q8H  . rivastigmine  4.6 mg Transdermal QHS  . sodium chloride  3 mL Intravenous Q12H  . tamsulosin  0.4 mg Oral QPC supper  . vancomycin  1,000 mg Intravenous Q24H    Continuous Infusions:   No past medical history on file.  No past surgical history on file.  Emmaline KluverHaley Evangelyn Crouse MS, RD, LDN

## 2014-05-28 NOTE — Progress Notes (Signed)
[facility]                                                                              Patient Demographics  [name], is a 80 y.o. male, DOB - January 29, 1930, ONG:[id number]  Admit date - 05/25/2014   Admitting Physician Costin Otelia SergeantM Gherghe, MD  Outpatient Primary MD for the patient is [name], MD  LOS - 3   No chief complaint on file.     HPI on 05/25/2014 [name] is a 80 y.o. male with a history of atrial fibrillation, hypertension, dementia on no anticoagulation, recent hospitalization and [facility] for failure to improve. Patient presented to [facility] for suspected urinary tract infection. Patient was placed on Levaquin and ceftriaxone. He then proceeded to develop pneumonia and was placed on Levaquin and Zosyn. Patient required BiPAP as well as 4 L of nasal cannula. Patient does not wear or use oxygen at home. Per family request, patient was [facility]. It appears that patient had been given plenty of fluids during his hospitalization for sepsis at [facility]. Echocardiogram was not obtained however patient did have elevated BNP. At the present admission, patient has no complaints. Per family patient has had shortness of breath, cough, some abdominal distention and pain. He did have a bowel movement earlier this morning.  Interim history [facility] [place] for failure to improve. His acute respiratory failure has improved, patient currently is on 2 L oxygen.  Patient continues to have leukocytosis however appears to be stable. VQ scan was negative for pulmonary embolism however confirm pneumonia with VQ defects. Echocardiogram was negative for heart failure.currently pending PT consult. We'll continue to monitor patient for at least 1 more day, if improved, patient may be ready for discharge on 05/29/2014.  Assessment & Plan   Acute respiratory  failure -Improving -Patient was [facility] -Patient was [facility] however failed to improve -possibly complicated by non-resolving pneumonia versus atrial fibrillation -Initially required 4L of oxygen and BiPAP at night. -Currently requiring 2 L oxygen via nasal cannula, oxygen saturations remain stable above 92% -Pulmonology consulted and appreciated, currently agree with management. -Continue vancomycin and Zosyn, DuoNeb treatments, guaifenesin -CXR: small b/l pleural effusion L>R, no convincing pulmonary edema, ?infiltrate left lung base -VQ scan: right lower lobe atelectasis or pneumonia matching ventilation/perfusion defects, no evidence of pulmonary embolism.  Elevated BNP -Patient appears euvolemic at this time -Patient does not have a formal diagnosis of heart failure however BNP noted I [facility] was over 6000 -Patient was given several liters of fluid during his hospital course at [facility], does appear to be volume overloaded at this time. -Echocardiogram: EF 60-65%, no mention of diastolic dysfunction -Given his AKI will stop lasix at this time  Sepsis secondary to pneumonia versus urinary tract infection -Patient initially presented with leukocytosis and was febrile to [facility] -Will continue treatment plan as above -CXR notes possible infiltrate, patient does have leukocytosis however remains afebrile -UA negative  Atrial fibrillation -Patient is not anticoagulation -Patient does have pacemaker -will start patient on metoprolol -TSH 0.81  Dementia (not otherwise classified) -Will continue patient on Namenda and Exelon patch -Appears to be at baseline per family  Acute on Chronic kidney disease, Stage III -Creatinine increased slightly to 2.28 -Likely secondary to lasix, will discontinue lasix and continue to monitor -Last creatinine here at [facility] was noted in 2009 to be 1.5 -Will  start gentle IV hydration (labs look hemoconcentrated)  History renal artery stenosis -Stable, status post stenting  History of BPH -continue Flomax  Normocytic anemia -Hb remains stable -Hb today 12.5  -Will continue to monitor CBC  Hyperglycemia -patient does not have a history of diabetes mellitus -Hemoglobin A1c 5.9 -Continue sensitive insulin sliding scale CBG monitoring  Hypertension -Continue amlodipine  Mildly Elevated LFTs -Will continue to monitor CMP  Deconditioning -PT consulted, pending eval  Code Status: DNR  Family Communication: None at bedside  Disposition Plan: Admitted, if AKI improves may consider dc on 11/11.  Pending PT eval  Time Spent in minutes   30 minutes  Procedures  Echocardiogram EF 60-65%  Consults   Pulmonology  DVT Prophylaxis  Lovenox  Lab Results  Component Value Date   PLT 680* 05/28/2014    Medications  Scheduled Meds: . amLODipine  10 mg Oral Daily  . dextromethorphan-guaiFENesin  1 tablet Oral BID  . enoxaparin (LOVENOX) injection  30 mg Subcutaneous Q24H  . furosemide  60 mg Intravenous Q12H  . insulin aspart  0-9 Units Subcutaneous TID WC  . ipratropium-albuterol  3 mL Nebulization BID  . memantine  10 mg Oral BID  . metoprolol tartrate  25 mg Oral BID  . pantoprazole  40 mg Oral Daily  . piperacillin-tazobactam (ZOSYN)  IV  3.375 g Intravenous Q8H  . rivastigmine  4.6 mg Transdermal QHS  . sodium chloride  3 mL Intravenous Q12H  . tamsulosin  0.4 mg Oral QPC supper  . vancomycin  1,000 mg Intravenous Q24H   Continuous Infusions: . sodium chloride 50 mL/hr at 05/27/14 1200   PRN Meds:.sodium chloride, acetaminophen **OR** acetaminophen, haloperidol lactate, LORazepam, sodium chloride  Antibiotics    Anti-infectives    Start     Dose/Rate Route Frequency Ordered Stop   05/25/14 1930  vancomycin (VANCOCIN) IVPB 1000 mg/200 mL premix     1,000 mg200 mL/hr over 60 Minutes Intravenous Every 24 hours  05/25/14 1840     05/25/14 1930  piperacillin-tazobactam (ZOSYN) IVPB 3.375 g     3.375 g12.5 mL/hr over 240 Minutes Intravenous Every 8 hours 05/25/14 1842          Subjective:   [name] seen and examined today.  Patient has dementia.  Currently denies any pain, shortness of breath, chest pain, abdominal pain. Asks if he can go home.  Objective:   Filed Vitals:   05/27/14 2151 05/28/14 0518 05/28/14 0519 05/28/14 0814  BP:   128/53   Pulse:   82   Temp:   98.8 F (37.1 C)   TempSrc:      Resp:   18   Height:      Weight:  65.9 kg (145 lb 4.5 oz)    SpO2: 98%  94% 95%    Wt Readings from Last 3 Encounters:  05/28/14 65.9 kg (145 lb 4.5 oz)  05/08/08 69.31 kg (152 lb 12.8 oz)     Intake/Output Summary (Last 24 hours) at 05/28/14 1041 Last data filed at 05/28/14 0943  Gross per 24 hour  Intake 1836.66 ml  Output   1230 ml  Net 606.66  ml    Exam  General: Well developed, well nourished, NAD, appears stated age  HEENT: NCAT, mucous membranes moist.   Cardiovascular: S1 S2 auscultated, irregular, 2/6 SEM  Respiratory: Clear to auscultation bilaterally with equal chest rise  Abdomen: Soft, nontender, nondistended, + bowel sounds  Extremities: warm dry without cyanosis clubbing or edema. Edema in LUE resolved  Neuro: AAO to self.  No focal deficits  Psych: Appropriate  Data Review   Micro Results Recent Results (from the past 240 hour(s))  MRSA PCR Screening     Status: None   Collection Time: 05/25/14  6:23 PM  Result Value Ref Range Status   MRSA by PCR NEGATIVE NEGATIVE Final    Comment:        The GeneXpert MRSA Assay (FDA approved for NASAL specimens only), is one component of a comprehensive MRSA colonization surveillance program. It is not intended to diagnose MRSA infection nor to guide or monitor treatment for MRSA infections.   Urine culture     Status: None   Collection Time: 05/27/14 12:29 PM  Result Value Ref Range Status    Specimen Description URINE, RANDOM  Final   Special Requests NONE  Final   Culture  Setup Time   Final    05/27/2014 13:01 Performed at [facility]    Colony Count NO GROWTH Performed at [facility]   Final   Culture NO GROWTH Performed at [facility]   Final   Report Status 05/28/2014 FINAL  Final    Radiology Reports Nm Pulmonary Perf And Vent  05/26/2014   CLINICAL DATA:  Shortness of breath. Unable to obtain lateral views due to patient limitations.  EXAM: NUCLEAR MEDICINE VENTILATION - PERFUSION LUNG SCAN  TECHNIQUE: Ventilation images were obtained in multiple projections using inhaled aerosol technetium 99 M DTPA. Perfusion images were obtained in multiple projections after intravenous injection of Tc-33m MAA.  RADIOPHARMACEUTICALS:  Forty mCi Tc-59m DTPA aerosol and 6 mCi Tc-26m MAA  COMPARISON:  Portable chest obtained yesterday.  FINDINGS: Ventilation: Pacemaker defect. Weight shaped area of lack of ventilation in the posterior aspect of the right lower lobe with corresponding airspace opacity the on the recent portable chest radiograph. Otherwise, normal ventilation of both lungs.  Perfusion: Pacemaker defect. With shaped area of lack of perfusion in the posterior aspect of the right lower lobe, corresponding to an area of lack of ventilation and airspace opacity. No perfusion defects to suggest acute pulmonary embolism.  IMPRESSION: Right lower lobe atelectasis or pneumonia with matching ventilation and perfusion defects. No evidence of pulmonary embolism.   Electronically Signed   By: Gordan Payment M.D.   On: 05/26/2014 13:35   Dg Chest Port 1 View  05/25/2014   CLINICAL DATA:  Short of breath.  EXAM: PORTABLE CHEST - 1 VIEW  COMPARISON:  05/23/2014  FINDINGS: There is hazy opacity at both lung bases, obscuring the left hemidiaphragm, consistent with small bilateral effusions. No overt pulmonary edema. Additional opacity at the left base is likely  atelectasis. Infiltrate is possible. No pneumothorax.  Cardiac silhouette is normal in size. No mediastinal or hilar masses.  Left anterior chest wall sequential pacemaker is stable.  IMPRESSION: 1. Small bilateral effusions, larger on the left. 2. No convincing pulmonary edema. 3. Additional left lung base opacity is likely atelectasis. Infiltrate is possible.   Electronically Signed   By: Amie Portland M.D.   On: 05/25/2014 21:11    CBC  Recent Labs Lab 05/25/14 1947  05/26/14 0328 05/27/14 0245 05/28/14 0616  WBC 14.1* 14.6* 15.2* 14.1*  HGB 10.8* 10.6* 12.1* 12.5*  HCT 32.2* 30.7* 35.4* 36.6*  PLT 534* 515* 660* 680*  MCV 89.7 89.8 89.4 89.3  MCH 30.1 31.0 30.6 30.5  MCHC 33.5 34.5 34.2 34.2  RDW 13.3 13.2 13.3 13.1  LYMPHSABS 0.9  --   --   --   MONOABS 0.9  --   --   --   EOSABS 0.0  --   --   --   BASOSABS 0.0  --   --   --     Chemistries   Recent Labs Lab 05/25/14 1947 05/26/14 0328 05/27/14 0245 05/28/14 0616  NA 135* 135* 138 137  K 4.5 4.4 3.7 3.6*  CL 97 97 95* 94*  CO2 20 22 26 26   GLUCOSE 158* 166* 106* 113*  BUN 23 27* 42* 41*  CREATININE 1.70* 1.91* 2.28* 2.30*  CALCIUM 9.3 9.0 9.0 8.7  MG 1.9  --   --   --   AST 66*  --   --   --   ALT 73*  --   --   --   ALKPHOS 58  --   --   --   BILITOT 0.5  --   --   --    ------------------------------------------------------------------------------------------------------------------ estimated creatinine clearance is 23.1 mL/min (by C-G formula based on Cr of 2.3). ------------------------------------------------------------------------------------------------------------------  Recent Labs  05/25/14 1947  HGBA1C 5.9*   ------------------------------------------------------------------------------------------------------------------  Recent Labs  05/25/14 1947  CHOL 147  HDL 40  LDLCALC 92  TRIG 74  CHOLHDL 3.7    ------------------------------------------------------------------------------------------------------------------  Recent Labs  05/25/14 1947  TSH 0.810   ------------------------------------------------------------------------------------------------------------------ No results for input(s): VITAMINB12, FOLATE, FERRITIN, TIBC, IRON, RETICCTPCT in the last 72 hours.  Coagulation profile No results for input(s): INR, PROTIME in the last 168 hours.  No results for input(s): DDIMER in the last 72 hours.  Cardiac Enzymes No results for input(s): CKMB, TROPONINI, MYOGLOBIN in the last 168 hours.  Invalid input(s): CK ------------------------------------------------------------------------------------------------------------------ Invalid input(s): POCBNP    Jatinder Mcdonagh D.O. on 05/28/2014 at 10:41 AM  Between 7am to 7pm - Pager - [phone]  After 7pm go to [email or web address] - password [email or web address]  And look for the night coverage person covering for me after hours  [facility] Office  [phone]

## 2014-05-28 NOTE — Clinical Documentation Improvement (Signed)
Patient's clinical picture exhibits symptoms or diagnostic findings potentially associated with Behavioral disturbances  Progress notes 05/26/14 per nursing staff: "very agitated, trying to climb out of bed, Bilateral wrist restraints  received. Medication administered ( Haldol 2.5 mg injection) but patient continued to be very aggressive  Noted pt has Dementia -Will continue patient on Namenda and Exelon patch  Please specify if patient has any of the conditions .-Dementia in conditions classified elsewhere with behavioral disturbances -Dementia not otherwise classified   Other (specify) . Document any associated diagnoses/conditions   Thank You,  Elpidio AnisGarnet Arran Fessel, RN, CDI [facility] [phone]

## 2014-05-29 ENCOUNTER — Encounter (HOSPITAL_COMMUNITY): Payer: Self-pay | Admitting: *Deleted

## 2014-05-29 LAB — CBC
HCT: 37.5 % — ABNORMAL LOW (ref 39.0–52.0)
Hemoglobin: 13 g/dL (ref 13.0–17.0)
MCH: 31.1 pg (ref 26.0–34.0)
MCHC: 34.7 g/dL (ref 30.0–36.0)
MCV: 89.7 fL (ref 78.0–100.0)
PLATELETS: 659 10*3/uL — AB (ref 150–400)
RBC: 4.18 MIL/uL — ABNORMAL LOW (ref 4.22–5.81)
RDW: 13.1 % (ref 11.5–15.5)
WBC: 18.1 10*3/uL — AB (ref 4.0–10.5)

## 2014-05-29 LAB — COMPREHENSIVE METABOLIC PANEL
ALBUMIN: 2.3 g/dL — AB (ref 3.5–5.2)
ALT: 43 U/L (ref 0–53)
AST: 22 U/L (ref 0–37)
Alkaline Phosphatase: 44 U/L (ref 39–117)
Anion gap: 16 — ABNORMAL HIGH (ref 5–15)
BUN: 43 mg/dL — ABNORMAL HIGH (ref 6–23)
CALCIUM: 8.4 mg/dL (ref 8.4–10.5)
CHLORIDE: 95 meq/L — AB (ref 96–112)
CO2: 27 mEq/L (ref 19–32)
Creatinine, Ser: 2.3 mg/dL — ABNORMAL HIGH (ref 0.50–1.35)
GFR calc Af Amer: 29 mL/min — ABNORMAL LOW (ref >90)
GFR calc non Af Amer: 25 mL/min — ABNORMAL LOW (ref >90)
Glucose, Bld: 112 mg/dL — ABNORMAL HIGH (ref 70–99)
Potassium: 3.6 mEq/L — ABNORMAL LOW (ref 3.7–5.3)
SODIUM: 138 meq/L (ref 137–147)
Total Bilirubin: 0.8 mg/dL (ref 0.3–1.2)
Total Protein: 6.1 g/dL (ref 6.0–8.3)

## 2014-05-29 LAB — GLUCOSE, CAPILLARY
GLUCOSE-CAPILLARY: 126 mg/dL — AB (ref 70–99)
GLUCOSE-CAPILLARY: 124 mg/dL — AB (ref 70–99)
Glucose-Capillary: 209 mg/dL — ABNORMAL HIGH (ref 70–99)
Glucose-Capillary: 139 mg/dL — ABNORMAL HIGH (ref 70–99)

## 2014-05-29 MED ORDER — DOXYCYCLINE HYCLATE 100 MG PO TABS
100.0000 mg | ORAL_TABLET | Freq: Two times a day (BID) | ORAL | Status: DC
  Administered 2014-05-29 – 2014-05-30 (×3): 100 mg via ORAL
  Filled 2014-05-29 (×4): qty 1

## 2014-05-29 MED ORDER — SODIUM CHLORIDE 0.9 % IV SOLN
INTRAVENOUS | Status: DC
  Administered 2014-05-29: 13:00:00 via INTRAVENOUS

## 2014-05-29 NOTE — Clinical Social Work Placement (Signed)
Clinical Social Work Department CLINICAL SOCIAL WORK PLACEMENT NOTE 05/29/2014  Patient:  [name],[name]  Account Number:  [id number] Admit date:  05/25/2014  Clinical Social Worker:  Lavell LusterJOSEPH BRYANT [name], LCSWA  Date/time:  05/29/2014 11:28 AM  Clinical Social Work is seeking post-discharge placement for this patient at the following level of care:   SKILLED NURSING   (*CSW will update this form in Epic as items are completed)   05/29/2014  Patient/family provided with [facility] Department of Clinical Social Work's list of facilities offering this level of care within the geographic area requested by the patient (or if unable, by the patient's family).  05/29/2014  Patient/family informed of their freedom to choose among providers that offer the needed level of care, that participate in Medicare, Medicaid or managed care program needed by the patient, have an available bed and are willing to accept the patient.  05/29/2014  Patient/family informed of [facility]' ownership interest in [facility], as well as of the fact that they are under no obligation to receive care at this facility.  PASARR submitted to EDS on  PASARR number received on   FL2 transmitted to all facilities in geographic area requested by pt/family on  05/29/2014 FL2 transmitted to all facilities within larger geographic area on   Patient informed that his/her managed care company has contracts with or will negotiate with  certain facilities, including the following:     Patient/family informed of bed offers received:   Patient chooses bed at  Physician recommends and patient chooses bed at    Patient to be transferred to  on   Patient to be transferred to facility by  Patient and family notified of transfer on  Name of family member notified:    The following physician request were entered in Epic:   Additional Comments:    Roddie McBryant Tiffny Gemmer MSW, [place]CS[place], [place], [phone]

## 2014-05-29 NOTE — Clinical Social Work Psychosocial (Signed)
Clinical Social Work Department BRIEF PSYCHOSOCIAL ASSESSMENT 05/29/2014  Patient:  [name], [name]     Account Number:  [id number]     Admit date:  05/25/2014  Clinical Social Worker:  Lovey Newcomer  Date/Time:  05/29/2014 11:11 AM  Referred by:  Physician  Date Referred:  05/29/2014 Referred for  SNF Placement   Other Referral:   NA   Interview type:  Family Other interview type:   Patient is somewhat confused, patient's wife (at bedside) and daughter (by phone) interviewed to complete assessment.    PSYCHOSOCIAL DATA Living Status:  WIFE Admitted from facility:   Level of care:   Primary support name:  [name] and [name] Primary support relationship to patient:  FAMILY Degree of support available:   Support is strong.    CURRENT CONCERNS Current Concerns  Post-Acute Placement   Other Concerns:   NA    SOCIAL WORK ASSESSMENT / PLAN CSW met with patient and wife at bedside to complete assessment. Per wife's request CSW contacted patient's daughter by phone to also include in assessment. Patient was admitted from his home where he lives with his wife. Both patient and wife receive assistance from an in home caregiver who is also at bedside. Wife and daughter are agreeable with plan for SNF as patient cannot be managed at home in his current state. Family states that their preferred facility would be [facility]. CSW explained SNF search/placement process and answered questions. CSW explained that preferred facility cannot be guaranteed. Patient's wife is clearly saddened by having to make this decision as she states, "This is heart wrenching, but he needs it." CSW offered emotional support. CSW will follow up with available bed offers.   Assessment/plan status:  Psychosocial Support/Ongoing Assessment of Needs Other assessment/ plan:   Complete Fl2, Fax, PASRR   Information/referral to community resources:   CSW contact information and SNF list given.     PATIENT'S/FAMILY'S RESPONSE TO PLAN OF CARE: Family plans for the patient to DC to SNF when medically stable. Family questioned when patient would be discharge. CSW explained that family needs to speak with MD regarding patient medical readiness for DC. CSW will assist.       [name] MSW, [place], [place], [phone]

## 2014-05-29 NOTE — Progress Notes (Signed)
[facility]                                                                              Patient Demographics  [name], is a 80 y.o. male, DOB - Sep 23, 1929, BJY:[id number]  Admit date - 05/25/2014   Admitting Physician Costin Otelia SergeantM Gherghe, MD  Outpatient Primary MD for the patient is [name], MD  LOS - 4   No chief complaint on file.     HPI on 05/25/2014 80 y.o. ? h/o atrial fibrillation, hypertension, dementia on no anticoagulation, recent hospitalization and [facility] for failure to improve. Patient presented to [facility] for suspected urinary tract infection.  Initially Levaquin and ceftriaxone-developed PNA thenplaced on Levaquin and Zosyn. Patient required BiPAP as well as 4 L of nasal cannula. Patient does not wear or use oxygen at home. Per family request, patient was transferred [facility].   Doing okay Confused Per wife no issues Nurse reports tol po well Had a stool yesterday No other C/o No o2 req right now  Assessment & Plan   Acute respiratory failure -Improving -Patient was [facility] -Patient was [facility] admitted [facility] with pneumonia however failed to improve -Initially required 4L of oxygen and BiPAP at night, now has no O2 Req -Pulmonology consulted and appreciated, currently agree with management. -Continue vancomycin and Zosyn, DuoNeb treatments, guaifenesin, transitioned to PO Doxycycline 05/29/14   Elevated BNP -Patient appears euvolemic at this time, so no lasix at present -Patient does not have a formal diagnosis of heart failure however BNP noted I [facility] was over 6000 -Patient was given several liters of fluid during his hospital course [facility], does appear to be volume overloaded at this time. -Echocardiogram: EF 60-65%, no mention of diastolic dysfunction  Sepsis secondary to pneumonia versus urinary tract  infection -Patient initially presented with leukocytosis and was febrile to [facility] -Will continue treatment plan as above -CXR notes possible infiltrate, patient does have leukocytosis however remains afebrile -UA negative  Atrial fibrillation -Patient is not on anticoagulation -Patient does have pacemaker -will start patient on metoprolol -TSH 0.81  Dementia (not otherwise classified) -Will continue patient on Namenda and Exelon patch -Appears to be at baseline per family  Acute on Chronic kidney disease, Stage III -Creatinine increased slightly to 2.28 -Likely secondary to lasix, will discontinue lasix and continue to monitor -Last creatinine here at [facility] was noted in 2009 to be 1.5 -saline 50 cc/hr started 11/11  History renal artery stenosis -Stable, status post stenting  History of BPH -continue Flomax  Normocytic anemia -Hb remains stable -Hb today 12.5  -Will continue to monitor CBC  Hyperglycemia -patient does not have a history of diabetes mellitus -Hemoglobin A1c 5.9 -Continue sensitive insulin sliding scale CBG monitoring  Hypertension -Continue amlodipine  Mildly Elevated LFTs -Will continue to monitor CMP  Deconditioning -PT consulted, pending eval  Code Status: DNR  Family Communication: None at bedside  Disposition Plan: Admitted, if AKI improves may consider dc on 11/11.  Pending PT eval  Time Spent in minutes   30 minutes  Procedures  Echocardiogram EF 60-65%  Consults  Pulmonology  DVT Prophylaxis  Lovenox  Lab Results  Component Value Date   PLT 659* 05/29/2014    Medications  Scheduled Meds: . amLODipine  10 mg Oral Daily  . dextromethorphan-guaiFENesin  1 tablet Oral BID  . enoxaparin (LOVENOX) injection  30 mg Subcutaneous Q24H  . feeding supplement (ENSURE COMPLETE)  237 mL Oral BID BM  . furosemide  60 mg Intravenous Q12H  . insulin aspart  0-9 Units Subcutaneous TID WC  . memantine  10 mg Oral BID  .  metoprolol tartrate  25 mg Oral BID  . pantoprazole  40 mg Oral Daily  . piperacillin-tazobactam (ZOSYN)  IV  3.375 g Intravenous Q8H  . rivastigmine  4.6 mg Transdermal QHS  . sodium chloride  3 mL Intravenous Q12H  . tamsulosin  0.4 mg Oral QPC supper  . vancomycin  1,000 mg Intravenous Q24H   Continuous Infusions:   PRN Meds:.sodium chloride, acetaminophen **OR** acetaminophen, haloperidol lactate, ipratropium-albuterol, LORazepam, sodium chloride  Antibiotics    Anti-infectives    Start     Dose/Rate Route Frequency Ordered Stop   05/25/14 1930  vancomycin (VANCOCIN) IVPB 1000 mg/200 mL premix     1,000 mg200 mL/hr over 60 Minutes Intravenous Every 24 hours 05/25/14 1840     05/25/14 1930  piperacillin-tazobactam (ZOSYN) IVPB 3.375 g     3.375 g12.5 mL/hr over 240 Minutes Intravenous Every 8 hours 05/25/14 1842          Subjective:   [name] seen and examined today.  Patient has dementia.  Currently denies any pain, shortness of breath, chest pain, abdominal pain. Asks if he can go home.  Objective:   Filed Vitals:   05/28/14 1444 05/28/14 2100 05/29/14 0718 05/29/14 1040  BP: 134/48 142/53 147/59 139/80  Pulse: 67 69 84 80  Temp: 98.9 F (37.2 C) 98.6 F (37 C) 98.3 F (36.8 C)   TempSrc: Axillary Oral Oral   Resp: 20 18 20    Height:      Weight:      SpO2: 97% 98% 98% 97%    Wt Readings from Last 3 Encounters:  05/28/14 65.9 kg (145 lb 4.5 oz)  05/08/08 69.31 kg (152 lb 12.8 oz)     Intake/Output Summary (Last 24 hours) at 05/29/14 1117 Last data filed at 05/29/14 0900  Gross per 24 hour  Intake    660 ml  Output   2700 ml  Net  -2040 ml    Exam  General: Well developed, well nourished, NAD, appears stated age  HEENT: NCAT, mucous membranes moist.   Cardiovascular: S1 S2 auscultated, irregular, 2/6 SEM  Respiratory: Clear to auscultation bilaterally with equal chest rise  Abdomen: Soft, nontender, nondistended, + bowel  sounds  Extremities: warm dry without cyanosis clubbing or edema. Edema in LUE resolved  Neuro: AAO to self.  No focal deficits  Psych: Appropriate  Data Review   Micro Results Recent Results (from the past 240 hour(s))  MRSA PCR Screening     Status: None   Collection Time: 05/25/14  6:23 PM  Result Value Ref Range Status   MRSA by PCR NEGATIVE NEGATIVE Final    Comment:        The GeneXpert MRSA Assay (FDA approved for NASAL specimens only), is one component of a comprehensive MRSA colonization surveillance program. It is not intended to diagnose MRSA infection nor to guide or monitor treatment for MRSA infections.   Urine culture     Status: None  Collection Time: 05/27/14 12:29 PM  Result Value Ref Range Status   Specimen Description URINE, RANDOM  Final   Special Requests NONE  Final   Culture  Setup Time   Final    05/27/2014 13:01 Performed at [facility]    Colony Count NO GROWTH Performed at [facility]   Final   Culture NO GROWTH Performed at [facility]   Final   Report Status 05/28/2014 FINAL  Final    Radiology Reports Nm Pulmonary Perf And Vent  05/26/2014   CLINICAL DATA:  Shortness of breath. Unable to obtain lateral views due to patient limitations.  EXAM: NUCLEAR MEDICINE VENTILATION - PERFUSION LUNG SCAN  TECHNIQUE: Ventilation images were obtained in multiple projections using inhaled aerosol technetium 99 M DTPA. Perfusion images were obtained in multiple projections after intravenous injection of Tc-68m MAA.  RADIOPHARMACEUTICALS:  Forty mCi Tc-14m DTPA aerosol and 6 mCi Tc-47m MAA  COMPARISON:  Portable chest obtained yesterday.  FINDINGS: Ventilation: Pacemaker defect. Weight shaped area of lack of ventilation in the posterior aspect of the right lower lobe with corresponding airspace opacity the on the recent portable chest radiograph. Otherwise, normal ventilation of both lungs.  Perfusion: Pacemaker defect. With  shaped area of lack of perfusion in the posterior aspect of the right lower lobe, corresponding to an area of lack of ventilation and airspace opacity. No perfusion defects to suggest acute pulmonary embolism.  IMPRESSION: Right lower lobe atelectasis or pneumonia with matching ventilation and perfusion defects. No evidence of pulmonary embolism.   Electronically Signed   By: Gordan Payment M.D.   On: 05/26/2014 13:35   Dg Chest Port 1 View  05/25/2014   CLINICAL DATA:  Short of breath.  EXAM: PORTABLE CHEST - 1 VIEW  COMPARISON:  05/23/2014  FINDINGS: There is hazy opacity at both lung bases, obscuring the left hemidiaphragm, consistent with small bilateral effusions. No overt pulmonary edema. Additional opacity at the left base is likely atelectasis. Infiltrate is possible. No pneumothorax.  Cardiac silhouette is normal in size. No mediastinal or hilar masses.  Left anterior chest wall sequential pacemaker is stable.  IMPRESSION: 1. Small bilateral effusions, larger on the left. 2. No convincing pulmonary edema. 3. Additional left lung base opacity is likely atelectasis. Infiltrate is possible.   Electronically Signed   By: Amie Portland M.D.   On: 05/25/2014 21:11    CBC  Recent Labs Lab 05/25/14 1947 05/26/14 0328 05/27/14 0245 05/28/14 0616 05/29/14 0532  WBC 14.1* 14.6* 15.2* 14.1* 18.1*  HGB 10.8* 10.6* 12.1* 12.5* 13.0  HCT 32.2* 30.7* 35.4* 36.6* 37.5*  PLT 534* 515* 660* 680* 659*  MCV 89.7 89.8 89.4 89.3 89.7  MCH 30.1 31.0 30.6 30.5 31.1  MCHC 33.5 34.5 34.2 34.2 34.7  RDW 13.3 13.2 13.3 13.1 13.1  LYMPHSABS 0.9  --   --   --   --   MONOABS 0.9  --   --   --   --   EOSABS 0.0  --   --   --   --   BASOSABS 0.0  --   --   --   --     Chemistries   Recent Labs Lab 05/25/14 1947 05/26/14 0328 05/27/14 0245 05/28/14 0616 05/29/14 0532  NA 135* 135* 138 137 138  K 4.5 4.4 3.7 3.6* 3.6*  CL 97 97 95* 94* 95*  CO2 20 22 26 26 27   GLUCOSE 158* 166* 106* 113* 112*  BUN  23  27* 42* 41* 43*  CREATININE 1.70* 1.91* 2.28* 2.30* 2.30*  CALCIUM 9.3 9.0 9.0 8.7 8.4  MG 1.9  --   --   --   --   AST 66*  --   --   --  22  ALT 73*  --   --   --  43  ALKPHOS 58  --   --   --  44  BILITOT 0.5  --   --   --  0.8   ------------------------------------------------------------------------------------------------------------------ estimated creatinine clearance is 23.1 mL/min (by C-G formula based on Cr of 2.3). ------------------------------------------------------------------------------------------------------------------ No results for input(s): HGBA1C in the last 72 hours. ------------------------------------------------------------------------------------------------------------------ No results for input(s): CHOL, HDL, LDLCALC, TRIG, CHOLHDL, LDLDIRECT in the last 72 hours. ------------------------------------------------------------------------------------------------------------------ No results for input(s): TSH, T4TOTAL, T3FREE, THYROIDAB in the last 72 hours.  Invalid input(s): FREET3 ------------------------------------------------------------------------------------------------------------------ No results for input(s): VITAMINB12, FOLATE, FERRITIN, TIBC, IRON, RETICCTPCT in the last 72 hours.  Coagulation profile No results for input(s): INR, PROTIME in the last 168 hours.  No results for input(s): DDIMER in the last 72 hours.  Cardiac Enzymes No results for input(s): CKMB, TROPONINI, MYOGLOBIN in the last 168 hours.  Invalid input(s): CK ------------------------------------------------------------------------------------------------------------------ Invalid input(s): POCBNP    Merita Hawks, JAI-GURMUKH D.O. on 05/29/2014 at 11:17 AM  Between 7am to 7pm - Pager - [phone]  After 7pm go to [email or web address] - password [email or web address]  And look for the night coverage person covering for me after hours  [facility] Office  [phone]

## 2014-05-29 NOTE — Progress Notes (Signed)
Physical Therapy Treatment Patient Details Name: [name] MRN: [id number] DOB: 1929/10/06 Today's Date: 05/29/2014    History of Present Illness Pt is an 80 y.o. male with a history of a-fib, hypertension, dementia, on no anticoagulation, recent hospitalization and [facility] for failure to improve. Patient [facility] to [facility] for suspected UTI. He then proceeded to develop pneumonia. Patient required BiPAP as well as 4 L of nasal cannula. Patient does not wear or use oxygen at home. Per family request, patient was [facility].    PT Comments    Pt progressing towards physical therapy goals. Pt was able to transfer bed to chair, however required mod assist to stand and maintain balance with RW. Feel pt is going to need 24 hour assist at d/c along with STR at the SNF level to improve strength and balance.   Pt was on RA throughout session and O2 sats remained 95-97% during mobility, and 94-96% while resting in the recliner after transfer. Per RN, supplemental O2 was left off at end of session.     Follow Up Recommendations  SNF;Supervision/Assistance - 24 hour     Equipment Recommendations  Rolling walker with 5" wheels;3in1 (PT) (If pt does not already have one. No family available to ask)    Recommendations for Other Services       Precautions / Restrictions Precautions Precautions: Fall Restrictions Weight Bearing Restrictions: No    Mobility  Bed Mobility Overal bed mobility: Needs Assistance Bed Mobility: Supine to Sit     Supine to sit: Mod assist     General bed mobility comments: Pt required step-by-step cues to transition to EOB. Bed pad was used to assist with scooting hips around. HHA for trunk elevation to full sitting position. Pt had difficulty maintaining upright posture and demonstrated posterior lean.   Transfers Overall transfer level: Needs assistance Equipment used: Rolling walker  (2 wheeled) Transfers: Sit to/from [employer];Stand Pivot Transfers Sit to Stand: Mod assist Stand pivot transfers: Min assist       General transfer comment: Pt required mod assist to power-up to full standing position and maintain balance. Min assist was provided for balance and walker positioning during pivot transfer to recliner chair.   Ambulation/Gait             General Gait Details: Unable - pt very unsteady with pivotal steps   Stairs            Wheelchair Mobility    Modified Rankin (Stroke Patients Only)       Balance Overall balance assessment: Needs assistance Sitting-balance support: Feet supported;No upper extremity supported Sitting balance-Leahy Scale: Fair Sitting balance - Comments: Demonstrates posterior lean however no physical assist required to maintain sitting balance at EOB.  Postural control: Posterior lean Standing balance support: Bilateral upper extremity supported;During functional activity Standing balance-Leahy Scale: Poor Standing balance comment: Requires UE support and assist to maintain standing balance.                     Cognition Arousal/Alertness: Awake/alert Behavior During Therapy: WFL for tasks assessed/performed Overall Cognitive Status: History of cognitive impairments - at baseline                      Exercises      General Comments        Pertinent Vitals/Pain Pain Assessment: No/denies pain    Home Living  Prior Function            PT Goals (current goals can now be found in the care plan section) Acute Rehab PT Goals Patient Stated Goal: Pt did not state goals. PT Goal Formulation: With patient Time For Goal Achievement: 06/11/14 Potential to Achieve Goals: Fair Progress towards PT goals: Progressing toward goals    Frequency  Min 2X/week    PT Plan Frequency needs to be updated    Co-evaluation             End of Session Equipment Utilized  During Treatment: Gait belt;Oxygen Activity Tolerance: Patient limited by fatigue Patient left: in chair;with chair alarm set;with call bell/phone within reach     Time: [id number] PT Time Calculation (min) (ACUTE ONLY): 27 min  Charges:  $Gait Training: 8-22 mins $Therapeutic Activity: 8-22 mins                    G Codes:      Conni SlipperKirkman, Loye Reininger 05/29/2014, 12:36 PM  Conni SlipperLaura Kelliann Pendergraph, PT, DPT Acute Rehabilitation Services Pager: [phone]

## 2014-05-30 LAB — CBC WITH DIFFERENTIAL/PLATELET
BASOS ABS: 0 10*3/uL (ref 0.0–0.1)
Basophils Relative: 0 % (ref 0–1)
EOS ABS: 0.4 10*3/uL (ref 0.0–0.7)
Eosinophils Relative: 2 % (ref 0–5)
HCT: 39.5 % (ref 39.0–52.0)
Hemoglobin: 13.3 g/dL (ref 13.0–17.0)
LYMPHS ABS: 2.7 10*3/uL (ref 0.7–4.0)
Lymphocytes Relative: 14 % (ref 12–46)
MCH: 30.9 pg (ref 26.0–34.0)
MCHC: 33.7 g/dL (ref 30.0–36.0)
MCV: 91.9 fL (ref 78.0–100.0)
Monocytes Absolute: 1.3 10*3/uL — ABNORMAL HIGH (ref 0.1–1.0)
Monocytes Relative: 7 % (ref 3–12)
Neutro Abs: 14.6 10*3/uL — ABNORMAL HIGH (ref 1.7–7.7)
Neutrophils Relative %: 77 % (ref 43–77)
PLATELETS: 659 10*3/uL — AB (ref 150–400)
RBC: 4.3 MIL/uL (ref 4.22–5.81)
RDW: 13.2 % (ref 11.5–15.5)
WBC: 19 10*3/uL — ABNORMAL HIGH (ref 4.0–10.5)

## 2014-05-30 LAB — GLUCOSE, CAPILLARY
Glucose-Capillary: 116 mg/dL — ABNORMAL HIGH (ref 70–99)
Glucose-Capillary: 184 mg/dL — ABNORMAL HIGH (ref 70–99)

## 2014-05-30 LAB — COMPREHENSIVE METABOLIC PANEL
ALBUMIN: 2.4 g/dL — AB (ref 3.5–5.2)
ALT: 35 U/L (ref 0–53)
ANION GAP: 17 — AB (ref 5–15)
AST: 18 U/L (ref 0–37)
Alkaline Phosphatase: 47 U/L (ref 39–117)
BILIRUBIN TOTAL: 0.6 mg/dL (ref 0.3–1.2)
BUN: 46 mg/dL — AB (ref 6–23)
CHLORIDE: 96 meq/L (ref 96–112)
CO2: 26 mEq/L (ref 19–32)
Calcium: 8.7 mg/dL (ref 8.4–10.5)
Creatinine, Ser: 2.23 mg/dL — ABNORMAL HIGH (ref 0.50–1.35)
GFR calc Af Amer: 30 mL/min — ABNORMAL LOW (ref >90)
GFR calc non Af Amer: 26 mL/min — ABNORMAL LOW (ref >90)
Glucose, Bld: 113 mg/dL — ABNORMAL HIGH (ref 70–99)
Potassium: 3.1 mEq/L — ABNORMAL LOW (ref 3.7–5.3)
Sodium: 139 mEq/L (ref 137–147)
TOTAL PROTEIN: 6.1 g/dL (ref 6.0–8.3)

## 2014-05-30 MED ORDER — DOXYCYCLINE HYCLATE 100 MG PO TABS: 100.0000 mg | ORAL_TABLET | Freq: Two times a day (BID) | ORAL | Status: AC

## 2014-05-30 MED ORDER — POTASSIUM CHLORIDE CRYS ER 20 MEQ PO TBCR
40.0000 meq | EXTENDED_RELEASE_TABLET | Freq: Once | ORAL | Status: AC
  Administered 2014-05-30: 40 meq via ORAL
  Filled 2014-05-30: qty 2

## 2014-05-30 NOTE — Discharge Summary (Signed)
Physician Discharge Summary  Delaine LameClifton F Brunet ZOX:[id number] DOB: 02-24-1930 DOA: 05/25/2014  PCP: [name], MD  Admit date: 05/25/2014 Discharge date: 05/30/2014  Time spent: 35 minutes  Recommendations for Outpatient Follow-up:  1. Recommend renal panel, CBC in about one week-potassium a little low on d/c 2. Recommend chest x-ray repeat in about 4 weeks to denote clearing of lung fields 3. Recommend reorientation and screening for aspiration if patient develops recurrent fevers 4. Recommend monitoring A. Fib and potential use of aspirin as he is not apparently an anticoagulation candidate 5. Repeat LFTs in a month 6. He had an A1c of 5.9 he was on sliding scale insulin this admission-deferred outpatient physician regarding his metformin or other medication 7. [facility]   Discharge Diagnoses:  Principal Problem:   Acute respiratory failure Active Problems:   Hyperlipidemia   Essential hypertension   BPH (benign prostatic hyperplasia)   HCAP (healthcare-associated pneumonia)   Elevated brain natriuretic peptide (BNP) level   CKD (chronic kidney disease) stage 3, GFR 30-59 ml/min   Hyperglycemia   Shortness of breath   Dementia   Discharge Condition: stable but overall poor prognosis  Diet recommendation: renal heart healthy  Filed Weights   05/27/14 0500 05/28/14 0518 05/30/14 0500  Weight: 69.8 kg (153 lb 14.1 oz) 65.9 kg (145 lb 4.5 oz) 65.8 kg (145 lb 1 oz)    History of present illness:  80 year old male history of H fibrillation, hypertension, dementia, recent hospitalization and then [facility] failure to improve. He was initially on Levaquin and ceftriaxone and placed on Levaquin and Zosyn the development of pneumonia, placed on BiPAP as well as 4 L nasal cannula without any prior oxygen requirement. Patient was treated for sepsis with copious amounts of fluid. He had a VQ  scan which only confirmed pneumonia echocardiogram is negative for heart failure.  Hospital Course:   Acute respiratory failure- Greatly improved. Initially was on 4 L and BiPAP at night however he has not had any further oxygen requirement and has not needed oxygen and pulmonology consulted on the patient and he was placed and continued on vancomycin and Zosyn for about 4 days during hospital stay. He was then transitioned to by mouth doxycycline twice a dayand had no further recurrences fever or chills although white count was slightly elevated He is recommended to have repeat complete blood count in a weekand a repeat chest x-ray in about a month  Elevated BNP- Patient is euvolemic he did not have a formal diagnosis of heart failure however sepsis Ms. Renaldo HarrisonGoss is BMP to be elevated over 6000 echocardiogram here showed EF 66% percent no mention of diastolic dysfunction and because he developed some acute kidney injury Lasix was held  Sepsis secondary to pneumonia and urinary tract infection UA done here was negative Leukocytosis persists and patient on steroids however patient has not had any further spikes in fever or other issues  Atrial fibrillation CHad2Vasc2 score<2 Patient not an anticoagulation but does have a AnimatorCerner. Metoprolol 25 was started this admission TSH was 0.81.  Dementia-patient is on Namenda as well as Exelon patch  Chronic kidney disease with acute kidney injury- This was thought to be Secondary to Lasix which was discontinued. [facility] to [facility].5. Patient was given IV hydration 50 cc per hour and was encouraged to eat  History renal artery stenosis-stable status post stenting  History BPH -continue Flomax  0.4  History hypertension -Continue amlodipine10 mg daily in addition to metoprolol 25 twice a day. -his HCTZ was restarted at 12.5 mg on discharge  Mild hypokalemia- -Given K Dur for one dose 40 mg on day of discharge may need  to(. Lasix was discontinued this admission  Procedures  Echocardiogram EF 60-65%  Consults  Pulmonology  DVT Prophylaxis Lovenox   Discharge Exam: Filed Vitals:   05/30/14 0638  BP: 141/44  Pulse: 87  Temp: 99.2 F (37.3 C)  Resp: 16    Sleepy this morning however no other issues Nurses report passed a uneventful evening  General: EOMI NCAT Cardiovascular: S1-S2 no murmur rub or gallop Respiratory: clinically clear no added sound  Discharge Instructions You were cared for by a hospitalist during your hospital stay. If you have any questions about your discharge medications or the care you received while you were in the hospital after you are discharged, you can call the unit and asked to speak with the hospitalist on call if the hospitalist that took care of you is not available. Once you are discharged, your primary care physician will handle any further medical issues. Please note that NO REFILLS for any discharge medications will be authorized once you are discharged, as it is imperative that you return to your primary care physician (or establish a relationship with a primary care physician if you do not have one) for your aftercare needs so that they can reassess your need for medications and monitor your lab values.  Discharge Instructions    Diet - low sodium heart healthy    Complete by:  As directed      Increase activity slowly    Complete by:  As directed           Current Discharge Medication List    START taking these medications   Details  doxycycline (VIBRA-TABS) 100 MG tablet Take 1 tablet (100 mg total) by mouth every 12 (twelve) hours. Qty: 8 tablet, Refills: 0      CONTINUE these medications which have NOT CHANGED   Details  acetaminophen (TYLENOL) 500 MG tablet Take 1,000 mg by mouth daily as needed for mild pain or headache.    amLODipine (NORVASC) 10 MG tablet Take 10 mg by mouth daily.     hydrochlorothiazide (MICROZIDE) 12.5 MG  capsule Take 12.5 mg by mouth daily.     memantine (NAMENDA) 10 MG tablet Take 10 mg by mouth 2 (two) times daily.     nitroGLYCERIN (NITROSTAT) 0.4 MG SL tablet Place 0.4 mg under the tongue every 5 (five) minutes as needed for chest pain.    Omega-3 Fatty Acids (FISH OIL) 1000 MG CAPS Take 1,000 mg by mouth daily.    rivastigmine (EXELON) 4.6 mg/24hr Place 4.6 mg onto the skin at bedtime.    tamsulosin (FLOMAX) 0.4 MG CAPS capsule Take 0.4 mg by mouth at bedtime.     tolterodine (DETROL LA) 4 MG 24 hr capsule Take 4 mg by mouth daily.     vitamin B-12 (CYANOCOBALAMIN) 1000 MCG tablet Take 1,000 mcg by mouth daily.      STOP taking these medications     omeprazole (PRILOSEC) 20 MG capsule        Allergies  Allergen Reactions  . Aricept [Donepezil Hcl] Other (See Comments)    Lost weight, no appetite.  . Codeine Other (See Comments)    Unknown reaction      The results of significant diagnostics from this hospitalization (including  imaging, microbiology, ancillary and laboratory) are listed below for reference.    Significant Diagnostic Studies: Nm Pulmonary Perf And Vent  05/26/2014   CLINICAL DATA:  Shortness of breath. Unable to obtain lateral views due to patient limitations.  EXAM: NUCLEAR MEDICINE VENTILATION - PERFUSION LUNG SCAN  TECHNIQUE: Ventilation images were obtained in multiple projections using inhaled aerosol technetium 99 M DTPA. Perfusion images were obtained in multiple projections after intravenous injection of Tc-36m MAA.  RADIOPHARMACEUTICALS:  Forty mCi Tc-51m DTPA aerosol and 6 mCi Tc-14m MAA  COMPARISON:  Portable chest obtained yesterday.  FINDINGS: Ventilation: Pacemaker defect. Weight shaped area of lack of ventilation in the posterior aspect of the right lower lobe with corresponding airspace opacity the on the recent portable chest radiograph. Otherwise, normal ventilation of both lungs.  Perfusion: Pacemaker defect. With shaped area of lack of  perfusion in the posterior aspect of the right lower lobe, corresponding to an area of lack of ventilation and airspace opacity. No perfusion defects to suggest acute pulmonary embolism.  IMPRESSION: Right lower lobe atelectasis or pneumonia with matching ventilation and perfusion defects. No evidence of pulmonary embolism.   Electronically Signed   By: Gordan Payment M.D.   On: 05/26/2014 13:35   Dg Chest Port 1 View  05/25/2014   CLINICAL DATA:  Short of breath.  EXAM: PORTABLE CHEST - 1 VIEW  COMPARISON:  05/23/2014  FINDINGS: There is hazy opacity at both lung bases, obscuring the left hemidiaphragm, consistent with small bilateral effusions. No overt pulmonary edema. Additional opacity at the left base is likely atelectasis. Infiltrate is possible. No pneumothorax.  Cardiac silhouette is normal in size. No mediastinal or hilar masses.  Left anterior chest wall sequential pacemaker is stable.  IMPRESSION: 1. Small bilateral effusions, larger on the left. 2. No convincing pulmonary edema. 3. Additional left lung base opacity is likely atelectasis. Infiltrate is possible.   Electronically Signed   By: Amie Portland M.D.   On: 05/25/2014 21:11    Microbiology: Recent Results (from the past 240 hour(s))  MRSA PCR Screening     Status: None   Collection Time: 05/25/14  6:23 PM  Result Value Ref Range Status   MRSA by PCR NEGATIVE NEGATIVE Final    Comment:        The GeneXpert MRSA Assay (FDA approved for NASAL specimens only), is one component of a comprehensive MRSA colonization surveillance program. It is not intended to diagnose MRSA infection nor to guide or monitor treatment for MRSA infections.   Urine culture     Status: None   Collection Time: 05/27/14 12:29 PM  Result Value Ref Range Status   Specimen Description URINE, RANDOM  Final   Special Requests NONE  Final   Culture  Setup Time   Final    05/27/2014 13:01 Performed at [facility]    Colony Count NO  GROWTH Performed at [facility]   Final   Culture NO GROWTH Performed at [facility]   Final   Report Status 05/28/2014 FINAL  Final     Labs: Basic Metabolic Panel:  Recent Labs Lab 05/25/14 1947 05/26/14 0328 05/27/14 0245 05/28/14 0616 05/29/14 0532 05/30/14 0418  NA 135* 135* 138 137 138 139  K 4.5 4.4 3.7 3.6* 3.6* 3.1*  CL 97 97 95* 94* 95* 96  CO2 20 22 26 26 27 26   GLUCOSE 158* 166* 106* 113* 112* 113*  BUN 23 27* 42* 41* 43* 46*  CREATININE 1.70*  1.91* 2.28* 2.30* 2.30* 2.23*  CALCIUM 9.3 9.0 9.0 8.7 8.4 8.7  MG 1.9  --   --   --   --   --   PHOS 4.9*  --   --   --   --   --    Liver Function Tests:  Recent Labs Lab 05/25/14 1947 05/29/14 0532 05/30/14 0418  AST 66* 22 18  ALT 73* 43 35  ALKPHOS 58 44 47  BILITOT 0.5 0.8 0.6  PROT 6.8 6.1 6.1  ALBUMIN 2.6* 2.3* 2.4*   No results for input(s): LIPASE, AMYLASE in the last 168 hours. No results for input(s): AMMONIA in the last 168 hours. CBC:  Recent Labs Lab 05/25/14 1947 05/26/14 0328 05/27/14 0245 05/28/14 0616 05/29/14 0532 05/30/14 0418  WBC 14.1* 14.6* 15.2* 14.1* 18.1* 19.0*  NEUTROABS 12.3*  --   --   --   --  14.6*  HGB 10.8* 10.6* 12.1* 12.5* 13.0 13.3  HCT 32.2* 30.7* 35.4* 36.6* 37.5* 39.5  MCV 89.7 89.8 89.4 89.3 89.7 91.9  PLT 534* 515* 660* 680* 659* 659*   Cardiac Enzymes: No results for input(s): CKTOTAL, CKMB, CKMBINDEX, TROPONINI in the last 168 hours. BNP: BNP (last 3 results) No results for input(s): PROBNP in the last 8760 hours. CBG:  Recent Labs Lab 05/29/14 0815 05/29/14 1255 05/29/14 1721 05/29/14 2219 05/30/14 0748  GLUCAP 126* 209* 124* 139* 116*       Signed:  Rhetta MuraSAMTANI, JAI-GURMUKH  [facility] 05/30/2014, 9:29 AM

## 2014-05-30 NOTE — Clinical Social Work Placement (Signed)
Clinical Social Work Department CLINICAL SOCIAL WORK PLACEMENT NOTE 05/30/2014  Patient:  [name],[name]  Account Number:  [id number] Admit date:  05/25/2014  Clinical Social Worker:  Cherre BlancJOSEPH BRYANT [name], [place]  Date/time:  05/30/2014 02:22 PM  Clinical Social Work is seeking post-discharge placement for this patient at the following level of care:   SKILLED NURSING   (*CSW will update this form in Epic as items are completed)   05/29/2014  Patient/family provided with [facility] Department of Clinical Social Work's list of facilities offering this level of care within the geographic area requested by the patient (or if unable, by the patient's family).  05/29/2014  Patient/family informed of their freedom to choose among providers that offer the needed level of care, that participate in Medicare, Medicaid or managed care program needed by the patient, have an available bed and are willing to accept the patient.  05/29/2014  Patient/family informed of [facility]' ownership interest in [facility], as well as of the fact that they are under no obligation to receive care at this facility.  PASARR submitted to EDS on 05/29/2014 PASARR number received on 05/29/2014  FL2 transmitted to all facilities in geographic area requested by pt/family on  05/29/2014 FL2 transmitted to all facilities within larger geographic area on   Patient informed that his/her managed care company has contracts with or will negotiate with  certain facilities, including the following:     Patient/family informed of bed offers received:  05/30/2014 Patient chooses bed at Other Physician recommends and patient chooses bed at    Patient to be transferred to Other on  05/30/2014 Patient to be transferred to facility by Ambulance Patient and family notified of transfer on 05/30/2014 Name of family member notified:  June and Emi Holeseborah Ruth  The following physician request were entered in  Epic:   Additional Comments:  Per MD patient ready [facility]. RN, patient, patient's family, and facility notified of DC. RN given number for report. DC packet on chart. AMbulance transport requested for patient (Service Request ID: [id number]). CSW signing off.   Roddie McBryant Asencion Loveday MSW, [place], [place], [phone]

## 2014-05-30 NOTE — Care Management Note (Signed)
    Page 1 of 1   05/30/2014     4:43:19 PM CARE MANAGEMENT NOTE 05/30/2014  Patient:  Delaine LameOE,Davine F   Account Number:  [id number]  Date Initiated:  05/30/2014  Documentation initiated by:  Letha CapeAYLOR,Lakiya Cottam  Subjective/Objective Assessment:   dx resp failure, pna  admit     Action/Plan:   pt eval- rec snf   Anticipated DC Date:  05/30/2014   Anticipated DC Plan:  SKILLED NURSING FACILITY  In-house referral  Clinical Social Worker      DC Planning Services  CM consult      Choice offered to / List presented to:             Status of service:  Completed, signed off Medicare Important Message given?  YES (If response is "NO", the following Medicare IM given date fields will be blank) Date Medicare IM given:  05/30/2014 Medicare IM given by:  Letha CapeAYLOR,Ambrielle Kington Date Additional Medicare IM given:   Additional Medicare IM given by:    Discharge Disposition:  SKILLED NURSING FACILITY  Per UR Regulation:  Reviewed for med. necessity/level of care/duration of stay  If discussed at Long Length of Stay Meetings, dates discussed:    Comments:  05/30/14 1639 Letha Capeeborah Jayona Mccaig RN BSN [phone] pt dc to snf. CSW following.

## 2014-05-31 NOTE — Progress Notes (Signed)
NURSING PROGRESS NOTE  Ian LameClifton [name] [id number] Discharge Data: 05/31/2014 12:33 PM Attending Provider: No att. providers found WUJ:[id number]:James Jonny RuizJohn, MD     [name] to be D/C'd Skilled nursing facility per MD order. Called report to [place] at the SNF.  Discussed with the patient the After Visit Summary and all questions fully answered. All IV's discontinued with no bleeding noted. All belongings returned to patient for patient to take home.   Last Vital Signs:  Blood pressure 119/42, pulse 95, temperature 97.7 F (36.5 C), temperature source Oral, resp. rate 16, height 5\' 9"  (1.753 m), weight 65.8 kg (145 lb 1 oz), SpO2 95 %.  Discharge Medication List   Medication List    STOP taking these medications        omeprazole 20 MG capsule  Commonly known as:  PRILOSEC      TAKE these medications        acetaminophen 500 MG tablet  Commonly known as:  TYLENOL  Take 1,000 mg by mouth daily as needed for mild pain or headache.     amLODipine 10 MG tablet  Commonly known as:  NORVASC  Take 10 mg by mouth daily.     doxycycline 100 MG tablet  Commonly known as:  VIBRA-TABS  Take 1 tablet (100 mg total) by mouth every 12 (twelve) hours.     Fish Oil 1000 MG Caps  Take 1,000 mg by mouth daily.     hydrochlorothiazide 12.5 MG capsule  Commonly known as:  MICROZIDE  Take 12.5 mg by mouth daily.     memantine 10 MG tablet  Commonly known as:  NAMENDA  Take 10 mg by mouth 2 (two) times daily.     nitroGLYCERIN 0.4 MG SL tablet  Commonly known as:  NITROSTAT  Place 0.4 mg under the tongue every 5 (five) minutes as needed for chest pain.     rivastigmine 4.6 mg/24hr  Commonly known as:  EXELON  Place 4.6 mg onto the skin at bedtime.     tamsulosin 0.4 MG Caps capsule  Commonly known as:  FLOMAX  Take 0.4 mg by mouth at bedtime.     tolterodine 4 MG 24 hr capsule  Commonly known as:  DETROL LA  Take 4 mg by mouth daily.     vitamin B-12 1000 MCG tablet  Commonly known  as:  CYANOCOBALAMIN  Take 1,000 mcg by mouth daily.

## 2015-01-10 NOTE — Patient Outreach (Signed)
[facility] Customer service manager [facility]) Care Management  01/10/2015  BERNALDO PENTICO 1929-12-13 [id number]   Referral from High Risk List, assigned Rowe Pavy, RN to outreach.  Corrie Mckusick. Sharlee Blew [facility] Care Management [facility] CM Assistant Phone: [phone] Fax: [phone]

## 2015-01-16 ENCOUNTER — Other Ambulatory Visit: Payer: Self-pay

## 2015-01-16 NOTE — Patient Outreach (Signed)
Referral from high risk list:  [facility]   Placed call to patient and wife answered. She reports that patient is in a nursing [facility]  [facility] [place]. Wife reports that patient is too much for her to handle.   Will close case as patient does not meet [facility] as he is in a higher level of care.   Will notify MD of patients placement.  Will send case closure letter to MD and Nena PolioLisa Moore.  Rowe PavyAmanda Aaren Krog, RN, BSN, CEN [facility] Care Coordinator [phone]

## 2015-01-22 NOTE — Patient Outreach (Signed)
[facility] Care Management  01/22/2015  Delaine LameClifton F Delangel Jan 01, 1930 [id number]   Notification from Rowe PavyAmanda Cook, RN to close case due to patient is now in a Nursing Facility.  Corrie MckusickLisa O. Sharlee BlewMoore, AABA [facility] Care Management [facility] CM Assistant Phone: [phone] Fax: [phone]

## 2016-01-17 DEATH — deceased

## 2016-02-24 IMAGING — CR DG CHEST 1V PORT
1 series · 1 of 1 positions shown · non-contrast
Comparison: 05/23/2014

CLINICAL DATA: Short of breath.

EXAM:
PORTABLE CHEST - 1 VIEW

[AP]
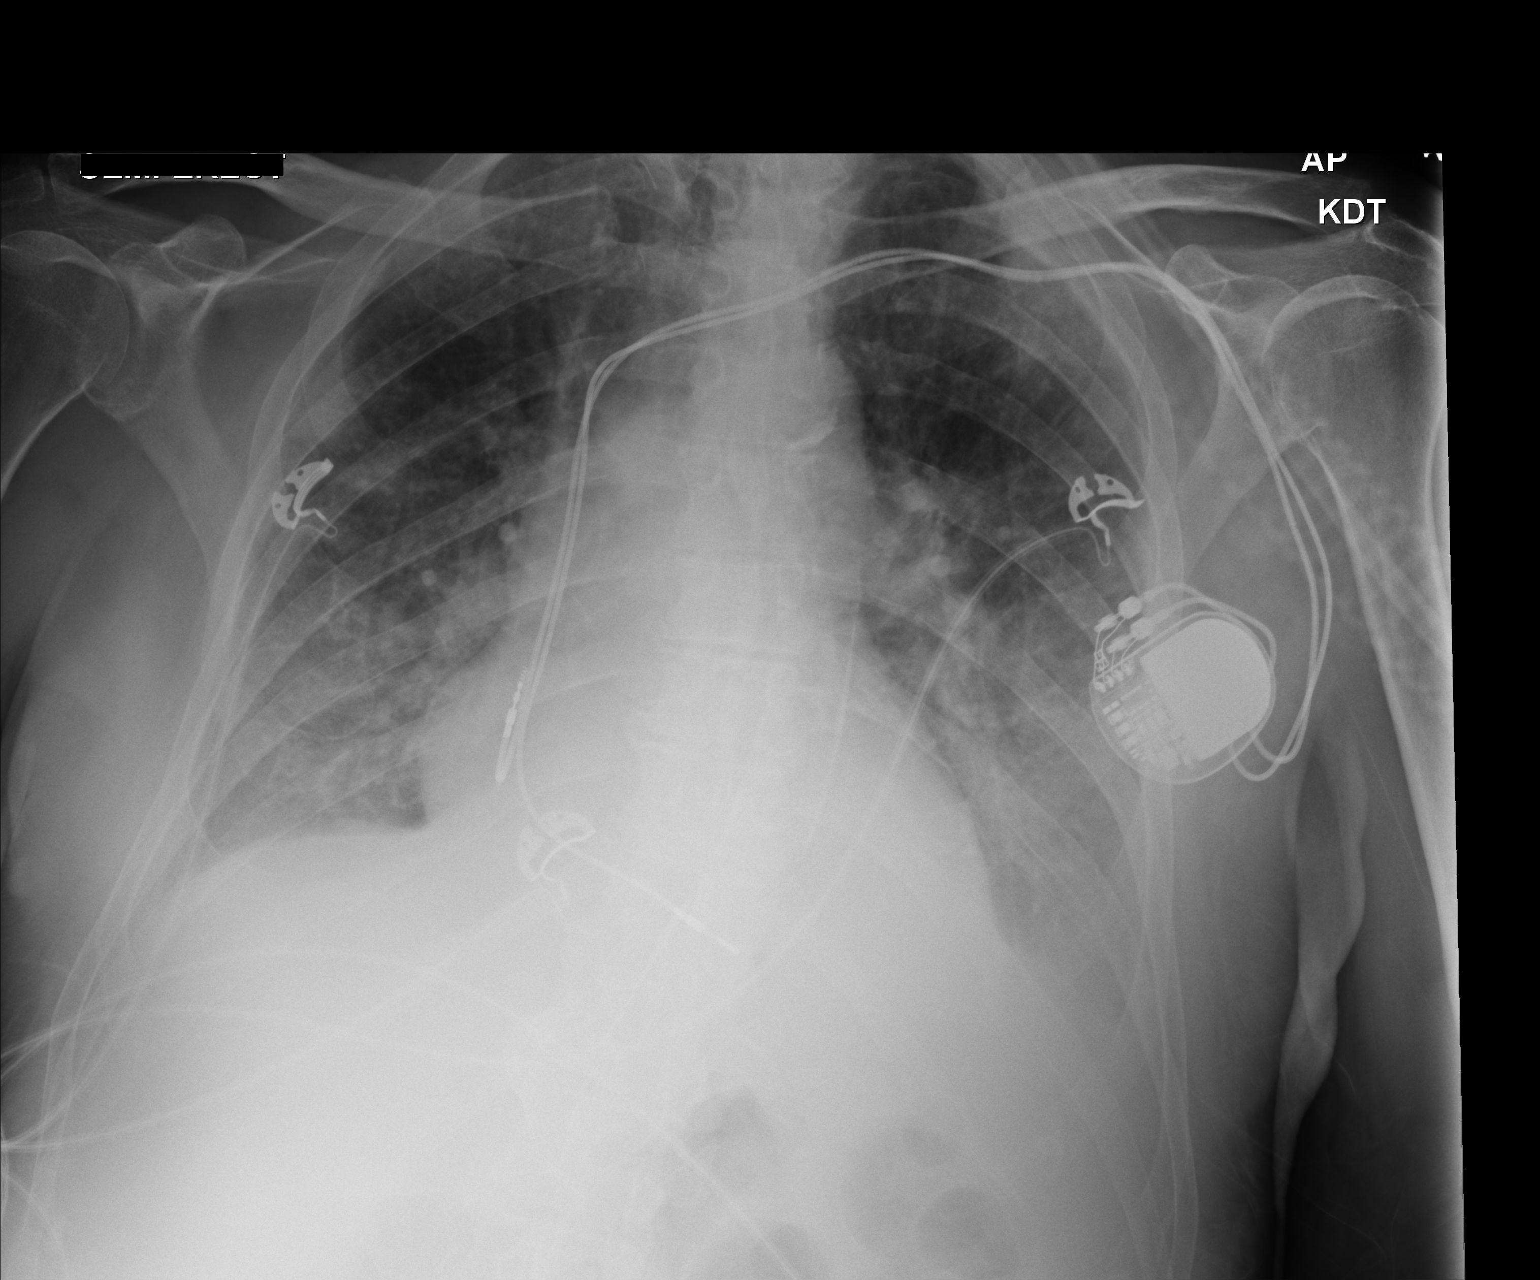

[1 of 1 positions shown; findings below may reference images not displayed]

FINDINGS: There is hazy opacity at both lung bases, obscuring the left
hemidiaphragm, consistent with small bilateral effusions. No overt
pulmonary edema. Additional opacity at the left base is likely
atelectasis. Infiltrate is possible. No pneumothorax.

Cardiac silhouette is normal in size. No mediastinal or hilar
masses.

Left anterior chest wall sequential pacemaker is stable.
IMPRESSION: 1. Small bilateral effusions, larger on the left.
2. No convincing pulmonary edema.
3. Additional left lung base opacity is likely atelectasis.
Infiltrate is possible.
